# Patient Record
Sex: Female | Born: 1960 | Hispanic: Yes | Marital: Married | Smoking: Never smoker
Health system: Southern US, Community
[De-identification: ages and names within clinical notes are randomized; demographics above are authoritative.]

## PROBLEM LIST (undated history)

## (undated) DIAGNOSIS — T7840XA Allergy, unspecified, initial encounter: Secondary | ICD-10-CM

## (undated) DIAGNOSIS — J45909 Unspecified asthma, uncomplicated: Secondary | ICD-10-CM

## (undated) DIAGNOSIS — I10 Essential (primary) hypertension: Secondary | ICD-10-CM

## (undated) HISTORY — DX: Allergy, unspecified, initial encounter: T78.40XA

## (undated) HISTORY — PX: BREAST SURGERY: SHX581

---

## 2020-10-05 ENCOUNTER — Other Ambulatory Visit (HOSPITAL_COMMUNITY): Payer: Self-pay | Admitting: Sports Medicine

## 2020-10-05 DIAGNOSIS — Z1231 Encounter for screening mammogram for malignant neoplasm of breast: Secondary | ICD-10-CM

## 2020-10-06 ENCOUNTER — Ambulatory Visit (HOSPITAL_COMMUNITY)
Admission: RE | Admit: 2020-10-06 | Discharge: 2020-10-06 | Disposition: A | Discharge status: Home / Self Care | Payer: Self-pay | Source: Ambulatory Visit | Attending: Sports Medicine | Admitting: Sports Medicine

## 2020-10-06 ENCOUNTER — Other Ambulatory Visit: Payer: Self-pay

## 2020-10-06 DIAGNOSIS — Z1231 Encounter for screening mammogram for malignant neoplasm of breast: Secondary | ICD-10-CM | POA: Insufficient documentation

## 2021-09-20 ENCOUNTER — Other Ambulatory Visit (HOSPITAL_COMMUNITY): Payer: Self-pay | Admitting: Sports Medicine

## 2021-09-20 DIAGNOSIS — Z1231 Encounter for screening mammogram for malignant neoplasm of breast: Secondary | ICD-10-CM

## 2021-10-10 ENCOUNTER — Other Ambulatory Visit: Payer: Self-pay

## 2021-10-10 ENCOUNTER — Ambulatory Visit (HOSPITAL_COMMUNITY)
Admission: RE | Admit: 2021-10-10 | Discharge: 2021-10-10 | Disposition: A | Discharge status: Home / Self Care | Payer: Self-pay | Source: Ambulatory Visit | Attending: Sports Medicine | Admitting: Sports Medicine

## 2021-10-10 DIAGNOSIS — Z1231 Encounter for screening mammogram for malignant neoplasm of breast: Secondary | ICD-10-CM | POA: Insufficient documentation

## 2022-05-01 ENCOUNTER — Ambulatory Visit
Admission: EM | Admit: 2022-05-01 | Discharge: 2022-05-01 | Disposition: A | Discharge status: Home / Self Care | Payer: No Typology Code available for payment source

## 2022-05-01 DIAGNOSIS — F418 Other specified anxiety disorders: Secondary | ICD-10-CM | POA: Diagnosis not present

## 2022-05-01 DIAGNOSIS — G479 Sleep disorder, unspecified: Secondary | ICD-10-CM | POA: Diagnosis not present

## 2022-05-01 HISTORY — DX: Unspecified asthma, uncomplicated: J45.909

## 2022-05-01 MED ORDER — HYDROXYZINE HCL 25 MG PO TABS: 25.0000 mg | ORAL_TABLET | Freq: Three times a day (TID) | ORAL | 0 refills | Status: DC | PRN

## 2022-05-01 NOTE — ED Provider Notes (Signed)
[facility]    CSN: [id number] Arrival date & time: 05/01/22  1138      History   Chief Complaint Chief Complaint  Patient presents with   Fatigue   Insomnia    HPI Dawn Arellano is a 61 y.o. female.   The patient is a 61 year old female who presents with difficulty sleeping.  Patient states that she is having increased stress due to having [place].  Patient states that she is the sole provider for her household here, and for her parents.  She states that she is also having to make medical decisions for her parents as they are in their 90s.  Patient states over the past 2 days she is averaged about 4 hours of sleep.  She states that she normally gets about 7 hours of sleep each night.  She states that she does exercise regularly, but has not been able to because of the fatigue she is currently experiencing.  Patient states that she normally is able to handle stressful situations and that she is "normally strong", but states this has been a lot for her.  Patient denies suicidal or homicidal ideations.  Patient states that she normally takes melatonin or Benadryl to help her sleep, but these medications are not working for her at this time.  The history is provided by the patient.   Past Medical History:  Diagnosis Date   Asthma     There are no problems to display for this patient.   History reviewed. No pertinent surgical history.  OB History   No obstetric history on file.      Home Medications    Prior to Admission medications   Medication Sig Start Date End Date Taking? Authorizing Provider  diphenhydrAMINE (BENADRYL) 50 MG tablet Take 50 mg by mouth at bedtime as needed for itching.   Yes [provider]  glucosamine-chondroitin 500-400 MG tablet Take 1 tablet by mouth 3 (three) times daily.   Yes [provider]  hydrOXYzine (ATARAX) 25 MG tablet Take 1 tablet (25 mg total) by mouth every 8 (eight) hours  as needed. 05/01/22  Yes Vista Sawatzky-Warren, Sadie Haberhristie J, NP  lisinopril (ZESTRIL) 10 MG tablet Take 10 mg by mouth daily.   Yes [provider]  montelukast (SINGULAIR) 10 MG tablet Take 10 mg by mouth at bedtime.   Yes [provider]  Multiple Vitamin (MULTIVITAMIN) tablet Take 1 tablet by mouth daily.   Yes [provider]  vitamin B-12 (CYANOCOBALAMIN) 1000 MCG tablet Take 1,000 mcg by mouth daily.   Yes [provider]  vitamin E 1000 UNIT capsule Take 1,000 Units by mouth daily.   Yes [provider]  Zinc Sulfate (ZINC-220 PO) Take by mouth.   Yes [provider]  albuterol (VENTOLIN HFA) 108 (90 Base) MCG/ACT inhaler SMARTSIG:2 Puff(s) By Mouth Every 4 Hours 04/05/22   [provider]  fluticasone-salmeterol (ADVAIR) 100-50 MCG/ACT AEPB daily. 04/05/22   [provider]    Family History History reviewed. No pertinent family history.  Social History Social History   Tobacco Use   Smoking status: Never   Smokeless tobacco: Never  Substance Use Topics   Alcohol use: Yes   Drug use: Never     Allergies   Nsaids   Review of Systems Review of Systems  Constitutional:  Positive for fatigue.  Respiratory: Negative.    Cardiovascular: Negative.   Gastrointestinal: Negative.   Psychiatric/Behavioral:  Positive for  decreased concentration and sleep disturbance. Negative for self-injury and suicidal ideas. The patient is nervous/anxious.     Physical Exam Triage Vital Signs ED Triage Vitals  Enc Vitals Group     BP 05/01/22 1256 (!) 150/81     Pulse Rate 05/01/22 1256 76     Resp 05/01/22 1256 18     Temp 05/01/22 1256 98.3 F (36.8 C)     Temp Source 05/01/22 1256 Oral     SpO2 05/01/22 1256 98 %     Weight --      Height --      Head Circumference --      Peak Flow --      Pain Score 05/01/22 1253 0     Pain Loc --      Pain Edu? --      Excl. in GC? --    No data found.  Updated Vital Signs BP  (!) 150/81 (BP Location: Right Arm)   Pulse 76   Temp 98.3 F (36.8 C) (Oral)   Resp 18   SpO2 98%   Visual Acuity Right Eye Distance:   Left Eye Distance:   Bilateral Distance:    Right Eye Near:   Left Eye Near:    Bilateral Near:     Physical Exam Vitals and nursing note reviewed.  Constitutional:      Appearance: Normal appearance. She is normal weight.  HENT:     Head: Normocephalic.  Cardiovascular:     Rate and Rhythm: Normal rate and regular rhythm.     Pulses: Normal pulses.     Heart sounds: Normal heart sounds.  Pulmonary:     Effort: Pulmonary effort is normal.     Breath sounds: Normal breath sounds.  Abdominal:     General: Bowel sounds are normal.     Palpations: Abdomen is soft.  Skin:    General: Skin is warm and dry.  Neurological:     General: No focal deficit present.     Mental Status: She is alert and oriented to person, place, and time.  Psychiatric:        Attention and Perception: Attention normal.        Mood and Affect: Mood is anxious.        Thought Content: Thought content normal.     UC Treatments / Results  Labs (all labs ordered are listed, but only abnormal results are displayed) Labs Reviewed - No data to display  EKG   Radiology No results found.  Procedures Procedures (including critical care time)  Medications Ordered in UC Medications - No data to display  Initial Impression / Assessment and Plan / UC Course  I have reviewed the triage vital signs and the nursing notes.  Pertinent labs & imaging results that were available during my care of the patient were reviewed by me and considered in my medical decision making (see chart for details).  The patient is a 61 year old female who presents with fatigue and sleep disturbance.  Symptoms have been present for the past 2 days.  Symptoms are related to the patient's increased stress associated with her parents.  Patient uses Benadryl and melatonin for her sleep, but  this is no longer helping, or has not helped over the past 2 days.  Her mood is anxious on exam.  She denies suicidal or homicidal ideations.  We will start the patient on hydroxyzine to see if this helps with her sleep disturbance.  Patient was  advised to try to find things that she enjoys, eating a healthy diet, and continuing her exercise.  Patient was advised to follow-up with her primary care physician if symptoms worsen or do not improve. Final Clinical Impressions(s) / UC Diagnoses   Final diagnoses:  Situational anxiety  Sleep disturbance     Discharge Instructions      Take medications as prescribed.  Please be advised these medications will make you drowsy. Take time out to do things you enjoy such as exercise. Try to get at least 7 hours of sleep each night. Recommend a healthy diet to help with improving her sleep and decreasing her stress. Follow-up with your primary care physician if symptoms worsen or do not improve.     ED Prescriptions     Medication Sig Dispense Auth. Provider   hydrOXYzine (ATARAX) 25 MG tablet Take 1 tablet (25 mg total) by mouth every 8 (eight) hours as needed. 30 tablet Van Ehlert-Warren, Sadie Haber, NP      PDMP not reviewed this encounter.   Abran Cantor, NP 05/01/22 1331

## 2022-05-01 NOTE — Discharge Instructions (Addendum)
Take medications as prescribed.  Please be advised these medications will make you drowsy. Take time out to do things you enjoy such as exercise. Try to get at least 7 hours of sleep each night. Recommend a healthy diet to help with improving her sleep and decreasing her stress. Follow-up with your primary care physician if symptoms worsen or do not improve.

## 2022-05-01 NOTE — ED Triage Notes (Signed)
Pt reports she is having trouble to sleep for the past 2 days. Benadryl is not helping, Pt states she is having a lot of stress as her father is in [place] and he is going to be transfer to an independent living facility.

## 2022-07-05 ENCOUNTER — Observation Stay (HOSPITAL_COMMUNITY)
Admission: EM | Admit: 2022-07-05 | Discharge: 2022-07-05 | Disposition: A | Discharge status: Home / Self Care | Payer: No Typology Code available for payment source | Attending: Internal Medicine | Admitting: Internal Medicine

## 2022-07-05 ENCOUNTER — Other Ambulatory Visit: Payer: Self-pay

## 2022-07-05 ENCOUNTER — Encounter (HOSPITAL_COMMUNITY): Payer: Self-pay

## 2022-07-05 DIAGNOSIS — L03114 Cellulitis of left upper limb: Principal | ICD-10-CM | POA: Insufficient documentation

## 2022-07-05 DIAGNOSIS — Z79899 Other long term (current) drug therapy: Secondary | ICD-10-CM | POA: Insufficient documentation

## 2022-07-05 DIAGNOSIS — M7989 Other specified soft tissue disorders: Secondary | ICD-10-CM | POA: Diagnosis present

## 2022-07-05 DIAGNOSIS — I10 Essential (primary) hypertension: Secondary | ICD-10-CM | POA: Diagnosis not present

## 2022-07-05 DIAGNOSIS — W5501XA Bitten by cat, initial encounter: Secondary | ICD-10-CM

## 2022-07-05 DIAGNOSIS — J45909 Unspecified asthma, uncomplicated: Secondary | ICD-10-CM | POA: Insufficient documentation

## 2022-07-05 DIAGNOSIS — L03119 Cellulitis of unspecified part of limb: Secondary | ICD-10-CM | POA: Diagnosis present

## 2022-07-05 HISTORY — DX: Essential (primary) hypertension: I10

## 2022-07-05 LAB — CBC WITH DIFFERENTIAL/PLATELET
Abs Immature Granulocytes: 0.05 10*3/uL (ref 0.00–0.07)
Basophils Absolute: 0 10*3/uL (ref 0.0–0.1)
Basophils Relative: 0 %
Eosinophils Absolute: 0 10*3/uL (ref 0.0–0.5)
Eosinophils Relative: 0 %
HCT: 41.1 % (ref 36.0–46.0)
Hemoglobin: 14.1 g/dL (ref 12.0–15.0)
Immature Granulocytes: 0 %
Lymphocytes Relative: 12 %
Lymphs Abs: 1.7 10*3/uL (ref 0.7–4.0)
MCH: 31.9 pg (ref 26.0–34.0)
MCHC: 34.3 g/dL (ref 30.0–36.0)
MCV: 93 fL (ref 80.0–100.0)
Monocytes Absolute: 0.9 10*3/uL (ref 0.1–1.0)
Monocytes Relative: 6 %
Neutro Abs: 11.7 10*3/uL — ABNORMAL HIGH (ref 1.7–7.7)
Neutrophils Relative %: 82 %
Platelets: 298 10*3/uL (ref 150–400)
RBC: 4.42 MIL/uL (ref 3.87–5.11)
RDW: 13.4 % (ref 11.5–15.5)
WBC: 14.5 10*3/uL — ABNORMAL HIGH (ref 4.0–10.5)
nRBC: 0 % (ref 0.0–0.2)

## 2022-07-05 LAB — COMPREHENSIVE METABOLIC PANEL
ALT: 18 U/L (ref 0–44)
AST: 23 U/L (ref 15–41)
Albumin: 4.3 g/dL (ref 3.5–5.0)
Alkaline Phosphatase: 73 U/L (ref 38–126)
Anion gap: 10 (ref 5–15)
BUN: 17 mg/dL (ref 8–23)
CO2: 26 mmol/L (ref 22–32)
Calcium: 9.4 mg/dL (ref 8.9–10.3)
Chloride: 102 mmol/L (ref 98–111)
Creatinine, Ser: 0.67 mg/dL (ref 0.44–1.00)
GFR, Estimated: >60 mL/min (ref >60)
Glucose, Bld: 119 mg/dL — ABNORMAL HIGH (ref 70–99)
Potassium: 4.2 mmol/L (ref 3.5–5.1)
Sodium: 138 mmol/L (ref 135–145)
Total Bilirubin: 0.7 mg/dL (ref 0.3–1.2)
Total Protein: 7.6 g/dL (ref 6.5–8.1)

## 2022-07-05 MED ORDER — HYDROCODONE-ACETAMINOPHEN 5-325 MG PO TABS
1.0000 | ORAL_TABLET | Freq: Once | ORAL | Status: AC
  Administered 2022-07-05: 1 via ORAL
  Filled 2022-07-05: qty 1

## 2022-07-05 MED ORDER — DOXYCYCLINE HYCLATE 100 MG PO CAPS
100.0000 mg | ORAL_CAPSULE | Freq: Two times a day (BID) | ORAL | 0 refills | Status: DC
Start: 2022-07-05 — End: 2023-05-23

## 2022-07-05 MED ORDER — HYDROCODONE-ACETAMINOPHEN 5-325 MG PO TABS: 1.0000 | ORAL_TABLET | Freq: Four times a day (QID) | ORAL | 0 refills | Status: DC | PRN

## 2022-07-05 MED ORDER — DOXYCYCLINE HYCLATE 100 MG PO TABS: 100.0000 mg | ORAL_TABLET | Freq: Two times a day (BID) | ORAL | 0 refills | Status: DC

## 2022-07-05 MED ORDER — AMOXICILLIN-POT CLAVULANATE 875-125 MG PO TABS: 1.0000 | ORAL_TABLET | Freq: Two times a day (BID) | ORAL | 0 refills | Status: DC

## 2022-07-05 MED ORDER — SODIUM CHLORIDE 0.9 % IV SOLN
3.0000 g | Freq: Once | INTRAVENOUS | Status: AC
  Administered 2022-07-05: 3 g via INTRAVENOUS
  Filled 2022-07-05: qty 8

## 2022-07-05 MED ORDER — DOXYCYCLINE HYCLATE 100 MG PO TABS
100.0000 mg | ORAL_TABLET | Freq: Two times a day (BID) | ORAL | Status: DC
  Administered 2022-07-05: 100 mg via ORAL
  Filled 2022-07-05: qty 1

## 2022-07-05 MED ORDER — AMOXICILLIN-POT CLAVULANATE 875-125 MG PO TABS: 1.0000 | ORAL_TABLET | Freq: Two times a day (BID) | ORAL | Status: DC

## 2022-07-05 MED ORDER — ALBUTEROL SULFATE HFA 108 (90 BASE) MCG/ACT IN AERS
2.0000 | INHALATION_SPRAY | Freq: Once | RESPIRATORY_TRACT | Status: AC
  Administered 2022-07-05: 2 via RESPIRATORY_TRACT
  Filled 2022-07-05: qty 6.7

## 2022-07-05 NOTE — ED Triage Notes (Signed)
Patient with cat bite Monday to left arm, this is her own house cat with up to date shots.  Arm is red, swollen, and painful to the touch. Redness goes from wrist to upper arm and is hot to the touch.

## 2022-07-05 NOTE — Consult Note (Signed)
Initial Consultation Note   Patient: Dawn Arellano ZOX:[id number] DOB: 05/16/1961 PCP: Pcp, No DOA: 07/05/2022 DOS: the patient was seen and examined on 07/05/2022 Primary service: Catarina Hartshorn, MD  Referring physician: Estell Harpin Reason for consult: cellulitis arm  Assessment/Plan: Cellulitis Left Arm -pt wants to go home -she is nontoxic appearing, not septic -she wants to try po antibiotics -d/c home with amox/clav and doxy x 7 days -received one dose Unasyn in ED -Rx Norco 5/325 #20  Hypertension -Continue lisinopril  Asthma -Stable on room air -Continue home inhalers    TRH will sign off at present, please call us again when needed.  HPI: Dawn Arellano is a 61 y.o. female with past medical history of hypertension and asthma presenting with erythema, edema, and pain on her left arm.  The patient fell asleep around 9 PM on 07/03/2022.  She felt that her cat was trying to play with her and may have scratched or bitten her arm.  She felt fine and went to work on 07/04/2022.  However she began noticing some mild tenderness on the dorsal distal aspect of her left arm on the evening of 07/04/2022.  By the morning of 07/05/2022, the patient noted erythema spreading proximally up her arm up to her antecubital fossa with increasing edema, pain, and erythema.  She did notice 2 bite marks on the dorsal radial surface of her left arm, but she never noticed any drainage, or blood coming out.  She denies any fevers, chills, headache, neck pain, chest pain, shortness of breath, cough, hemoptysis, nausea, vomiting, diarrhea.  She denies any recent injuries or other trauma to the arm.  She denies any other rashes.  She has not been out in the woods hiking camping and fishing. In the ED, the patient was afebrile and hemodynamically stable.  WBC 14.5, hemoglobin 14.1, platelets 290,000.  Sodium 138, potassium 4.2, bicarbonate 26, BUN 17, creatinine 0.67.  LFTs were unremarkable. The patient was given a dose of  Unasyn and hydrocodone in the ED after which she began feeling better with less pain and improved mobility in the left arm.  The patient requested to be able to go home to take oral antibiotics.  Review of Systems: As mentioned in the history of present illness. All other systems reviewed and are negative. Past Medical History:  Diagnosis Date   Asthma    Hypertension    History reviewed. No pertinent surgical history. Social History:  reports that she has never smoked. She has never used smokeless tobacco. She reports current alcohol use. She reports that she does not use drugs.  Allergies  Allergen Reactions   Nsaids Anaphylaxis    History reviewed. No pertinent family history.  Prior to Admission medications   Medication Sig Start Date End Date Taking? Authorizing Provider  albuterol (VENTOLIN HFA) 108 (90 Base) MCG/ACT inhaler SMARTSIG:2 Puff(s) By Mouth Every 4 Hours 04/05/22  Yes [provider]  diphenhydrAMINE (BENADRYL) 50 MG tablet Take 50 mg by mouth at bedtime as needed for itching.   Yes [provider]  fluticasone-salmeterol (ADVAIR) 100-50 MCG/ACT AEPB Inhale 1 puff into the lungs daily. 04/05/22  Yes [provider]  HYDROcodone-acetaminophen (NORCO) 5-325 MG tablet Take 1 tablet by mouth every 6 (six) hours as needed for moderate pain. 07/05/22  Yes Amalya Salmons, Onalee Hua, MD  lisinopril (ZESTRIL) 10 MG tablet Take 10 mg by mouth daily.   Yes [provider]  montelukast (SINGULAIR) 10 MG tablet Take 10 mg by mouth at bedtime.  Yes [provider]  Multiple Vitamin (MULTIVITAMIN) tablet Take 1 tablet by mouth daily.   Yes [provider]  vitamin B-12 (CYANOCOBALAMIN) 1000 MCG tablet Take 1,000 mcg by mouth daily.   Yes [provider]  vitamin E 1000 UNIT capsule Take 1,000 Units by mouth daily.   Yes [provider]  Zinc Sulfate (ZINC-220 PO) Take by mouth.   Yes [provider]   amoxicillin-clavulanate (AUGMENTIN) 875-125 MG tablet Take 1 tablet by mouth every 12 (twelve) hours. 07/05/22   Catarina Hartshorn, MD  doxycycline (VIBRA-TABS) 100 MG tablet Take 1 tablet (100 mg total) by mouth every 12 (twelve) hours. 07/05/22   Catarina Hartshorn, MD    Physical Exam: Vitals:   07/05/22 0917 07/05/22 1100  BP:  (!) 148/96  Pulse:  (!) 101  Resp:  16  SpO2:  99%  Weight: 52.2 kg   Height: 5\' 2"  (1.575 m)   GENERAL:  A&O x 3, NAD, well developed, cooperative, follows commands HEENT: [place]/AT, No thrush, No icterus, No oral ulcers Neck:  No neck mass, No meningismus, soft, supple CV: RRR, no S3, no S4, no rub, no JVD Lungs:  CTA, no wheeze, no rhonchi, good air movement Abd: soft/NT +BS, nondistended Ext: No edema, no lymphangitis, no cyanosis, no rashes Neuro:  CN II-XII intact, strength 4/5 in RUE, RLE, strength 4/5 LUE, LLE; sensation intact bilateral; no dysmetria; babinski equivocal Left arm with erythema from the radial head extending to the antecubital fossa primarily on the dorsal aspect.  There is edema and erythema without any crepitance.  There is no open drainage.  There is 2 bite marks on the dorsal surface of the radial side without any necrosis or drainage.  Data Reviewed:  Data reviewed in history above  Family Communication: none Primary team communication: none Thank you very much for involving in the care of your patient.  Author: [place], MD 07/05/2022 11:50 AM  For on call review [email or web address].

## 2022-07-05 NOTE — Discharge Instructions (Signed)
Follow-up as instructed by Dr. Arbutus Leas

## 2022-07-05 NOTE — ED Provider Notes (Signed)
[facility] EMERGENCY DEPARTMENT Provider Note   CSN: [id number] Arrival date & time: 07/05/22  5277     History {Add pertinent medical, surgical, social history, OB history to HPI:1} Chief Complaint  Patient presents with   Animal Bite    Dawn Arellano is a 61 y.o. female.  Patient with a history of bronchospasm.  She states her cat bit her left arm on Monday and now she has swelling and redness and tenderness to the arm   Animal Bite      Home Medications Prior to Admission medications   Medication Sig Start Date End Date Taking? Authorizing Provider  albuterol (VENTOLIN HFA) 108 (90 Base) MCG/ACT inhaler SMARTSIG:2 Puff(s) By Mouth Every 4 Hours 04/05/22  Yes [provider]  diphenhydrAMINE (BENADRYL) 50 MG tablet Take 50 mg by mouth at bedtime as needed for itching.   Yes [provider]  fluticasone-salmeterol (ADVAIR) 100-50 MCG/ACT AEPB Inhale 1 puff into the lungs daily. 04/05/22  Yes [provider]  lisinopril (ZESTRIL) 10 MG tablet Take 10 mg by mouth daily.   Yes [provider]  montelukast (SINGULAIR) 10 MG tablet Take 10 mg by mouth at bedtime.   Yes [provider]  Multiple Vitamin (MULTIVITAMIN) tablet Take 1 tablet by mouth daily.   Yes [provider]  vitamin B-12 (CYANOCOBALAMIN) 1000 MCG tablet Take 1,000 mcg by mouth daily.   Yes [provider]  vitamin E 1000 UNIT capsule Take 1,000 Units by mouth daily.   Yes [provider]  Zinc Sulfate (ZINC-220 PO) Take by mouth.   Yes [provider]  hydrOXYzine (ATARAX) 25 MG tablet Take 1 tablet (25 mg total) by mouth every 8 (eight) hours as needed. Patient not taking: Reported on 07/05/2022 05/01/22   Leath-Warren, Sadie Haber, NP      Allergies    Nsaids    Review of Systems   Review of Systems  Physical Exam Updated Vital Signs BP (!) 148/96   Pulse (!) 101   Resp 16   Ht 5\' 2"  (1.575 m)   Wt 52.2 kg   SpO2 99%    BMI 21.03 kg/m  Physical Exam  ED Results / Procedures / Treatments   Labs (all labs ordered are listed, but only abnormal results are displayed) Labs Reviewed  CBC WITH DIFFERENTIAL/PLATELET - Abnormal; Notable for the following components:      Result Value   WBC 14.5 (*)    Neutro Abs 11.7 (*)    All other components within normal limits  COMPREHENSIVE METABOLIC PANEL - Abnormal; Notable for the following components:   Glucose, Bld 119 (*)    All other components within normal limits    EKG None  Radiology No results found.  Procedures Procedures  {Document cardiac monitor, telemetry assessment procedure when appropriate:1}  Medications Ordered in ED Medications  Ampicillin-Sulbactam (UNASYN) 3 g in sodium chloride 0.9 % 100 mL IVPB (3 g Intravenous New Bag/Given 07/05/22 1004)  HYDROcodone-acetaminophen (NORCO/VICODIN) 5-325 MG per tablet 1 tablet (1 tablet Oral Given 07/05/22 1000)  albuterol (VENTOLIN HFA) 108 (90 Base) MCG/ACT inhaler 2 puff (2 puffs Inhalation Given 07/05/22 1020)    ED Course/ Medical Decision Making/ A&P Patient was given Unasyn and observed for 2 hours.  After that time her infection has increased in size by 1 cm                         Medical Decision Making  Amount and/or Complexity of Data Reviewed Labs: ordered.  Risk Prescription drug management. Decision regarding hospitalization.   Cellulitis to left arm.  She will be admitted for IV antibiotics    Patient was seen by the hospitalist and it was decided that the patient go home on Augmentin and doxycycline  {Document critical care time when appropriate:1} {Document review of labs and clinical decision tools ie heart score, Chads2Vasc2 etc:1}  {Document your independent review of radiology images, and any outside records:1} {Document your discussion with family members, caretakers, and with consultants:1} {Document social determinants of health affecting pt's care:1} {Document  your decision making why or why not admission, treatments were needed:1} Final Clinical Impression(s) / ED Diagnoses Final diagnoses:  None    Rx / DC Orders ED Discharge Orders     None

## 2022-08-25 ENCOUNTER — Other Ambulatory Visit (HOSPITAL_COMMUNITY)
Admission: RE | Admit: 2022-08-25 | Discharge: 2022-08-25 | Disposition: A | Discharge status: Home / Self Care | Payer: No Typology Code available for payment source | Source: Ambulatory Visit | Attending: Internal Medicine | Admitting: Internal Medicine

## 2022-08-25 DIAGNOSIS — I10 Essential (primary) hypertension: Secondary | ICD-10-CM | POA: Insufficient documentation

## 2022-08-25 DIAGNOSIS — K219 Gastro-esophageal reflux disease without esophagitis: Secondary | ICD-10-CM | POA: Insufficient documentation

## 2022-08-25 LAB — BASIC METABOLIC PANEL
Anion gap: 10 (ref 5–15)
BUN: 27 mg/dL — ABNORMAL HIGH (ref 8–23)
CO2: 25 mmol/L (ref 22–32)
Calcium: 9.1 mg/dL (ref 8.9–10.3)
Chloride: 103 mmol/L (ref 98–111)
Creatinine, Ser: 0.58 mg/dL (ref 0.44–1.00)
GFR, Estimated: >60 mL/min (ref >60)
Glucose, Bld: 99 mg/dL (ref 70–99)
Potassium: 4 mmol/L (ref 3.5–5.1)
Sodium: 138 mmol/L (ref 135–145)

## 2022-08-25 LAB — HEPATIC FUNCTION PANEL
ALT: 18 U/L (ref 0–44)
AST: 21 U/L (ref 15–41)
Albumin: 4.2 g/dL (ref 3.5–5.0)
Alkaline Phosphatase: 69 U/L (ref 38–126)
Bilirubin, Direct: <0.1 mg/dL (ref 0.0–0.2)
Total Bilirubin: 0.8 mg/dL (ref 0.3–1.2)
Total Protein: 7.2 g/dL (ref 6.5–8.1)

## 2022-08-25 LAB — CBC
HCT: 40.8 % (ref 36.0–46.0)
Hemoglobin: 13.8 g/dL (ref 12.0–15.0)
MCH: 31.5 pg (ref 26.0–34.0)
MCHC: 33.8 g/dL (ref 30.0–36.0)
MCV: 93.2 fL (ref 80.0–100.0)
Platelets: 270 10*3/uL (ref 150–400)
RBC: 4.38 MIL/uL (ref 3.87–5.11)
RDW: 12.9 % (ref 11.5–15.5)
WBC: 4.9 10*3/uL (ref 4.0–10.5)
nRBC: 0 % (ref 0.0–0.2)

## 2022-08-25 LAB — LIPID PANEL
Cholesterol: 220 mg/dL — ABNORMAL HIGH (ref 0–200)
HDL: 65 mg/dL (ref >40)
LDL Cholesterol: 136 mg/dL — ABNORMAL HIGH (ref 0–99)
Total CHOL/HDL Ratio: 3.4 RATIO
Triglycerides: 96 mg/dL (ref <150)
VLDL: 19 mg/dL (ref 0–40)

## 2022-08-25 LAB — TSH: TSH: 1.358 u[IU]/mL (ref 0.350–4.500)

## 2022-08-25 LAB — HEMOGLOBIN A1C
Hgb A1c MFr Bld: 5.5 % (ref 4.8–5.6)
Mean Plasma Glucose: 111.15 mg/dL

## 2022-08-25 LAB — VITAMIN D 25 HYDROXY (VIT D DEFICIENCY, FRACTURES): Vit D, 25-Hydroxy: 36.69 ng/mL (ref 30–100)

## 2022-08-28 LAB — T4, FREE: Free T4: 0.95 ng/dL (ref 0.61–1.12)

## 2022-09-11 ENCOUNTER — Other Ambulatory Visit (HOSPITAL_COMMUNITY): Payer: Self-pay | Admitting: Internal Medicine

## 2022-09-11 DIAGNOSIS — Z1231 Encounter for screening mammogram for malignant neoplasm of breast: Secondary | ICD-10-CM

## 2022-10-12 ENCOUNTER — Ambulatory Visit (HOSPITAL_COMMUNITY)
Admission: RE | Admit: 2022-10-12 | Discharge: 2022-10-12 | Disposition: A | Discharge status: Home / Self Care | Payer: No Typology Code available for payment source | Source: Ambulatory Visit | Attending: Internal Medicine | Admitting: Internal Medicine

## 2022-10-12 DIAGNOSIS — Z1231 Encounter for screening mammogram for malignant neoplasm of breast: Secondary | ICD-10-CM | POA: Insufficient documentation

## 2023-05-23 ENCOUNTER — Ambulatory Visit (INDEPENDENT_AMBULATORY_CARE_PROVIDER_SITE_OTHER): Payer: 59 | Admitting: Family Medicine

## 2023-05-23 ENCOUNTER — Other Ambulatory Visit: Payer: Self-pay | Admitting: Family Medicine

## 2023-05-23 ENCOUNTER — Encounter: Payer: Self-pay | Admitting: Family Medicine

## 2023-05-23 VITALS — BP 134/84 | HR 81 | Resp 16 | Ht 61.0 in | Wt 111.8 lb

## 2023-05-23 DIAGNOSIS — G479 Sleep disorder, unspecified: Secondary | ICD-10-CM | POA: Diagnosis not present

## 2023-05-23 DIAGNOSIS — M2041 Other hammer toe(s) (acquired), right foot: Secondary | ICD-10-CM

## 2023-05-23 DIAGNOSIS — I10 Essential (primary) hypertension: Secondary | ICD-10-CM | POA: Diagnosis not present

## 2023-05-23 DIAGNOSIS — E7849 Other hyperlipidemia: Secondary | ICD-10-CM

## 2023-05-23 DIAGNOSIS — Z1211 Encounter for screening for malignant neoplasm of colon: Secondary | ICD-10-CM

## 2023-05-23 DIAGNOSIS — J4520 Mild intermittent asthma, uncomplicated: Secondary | ICD-10-CM | POA: Diagnosis not present

## 2023-05-23 DIAGNOSIS — Z13820 Encounter for screening for osteoporosis: Secondary | ICD-10-CM

## 2023-05-23 DIAGNOSIS — E038 Other specified hypothyroidism: Secondary | ICD-10-CM

## 2023-05-23 DIAGNOSIS — Z114 Encounter for screening for human immunodeficiency virus [HIV]: Secondary | ICD-10-CM

## 2023-05-23 DIAGNOSIS — R7301 Impaired fasting glucose: Secondary | ICD-10-CM | POA: Diagnosis not present

## 2023-05-23 DIAGNOSIS — Z1159 Encounter for screening for other viral diseases: Secondary | ICD-10-CM

## 2023-05-23 DIAGNOSIS — E559 Vitamin D deficiency, unspecified: Secondary | ICD-10-CM | POA: Diagnosis not present

## 2023-05-23 MED ORDER — AIRSUPRA 90-80 MCG/ACT IN AERO
2.0000 | INHALATION_SPRAY | Freq: Four times a day (QID) | RESPIRATORY_TRACT | 2 refills | Status: DC | PRN
Start: 2023-05-23 — End: 2023-06-25

## 2023-05-23 MED ORDER — ALBUTEROL SULFATE HFA 108 (90 BASE) MCG/ACT IN AERS
2.0000 | INHALATION_SPRAY | Freq: Four times a day (QID) | RESPIRATORY_TRACT | 0 refills | Status: DC | PRN
Start: 2023-05-23 — End: 2024-06-25

## 2023-05-23 MED ORDER — MONTELUKAST SODIUM 10 MG PO TABS
10.0000 mg | ORAL_TABLET | Freq: Every day | ORAL | 1 refills | Status: DC
Start: 2023-05-23 — End: 2023-07-31

## 2023-05-23 MED ORDER — HYDROXYZINE PAMOATE 25 MG PO CAPS
25.0000 mg | ORAL_CAPSULE | Freq: Every evening | ORAL | 1 refills | Status: DC | PRN
Start: 2023-05-23 — End: 2023-07-31

## 2023-05-23 NOTE — Progress Notes (Signed)
New Patient Office Visit  Subjective:  Patient ID: Dawn Arellano, female    DOB: 1961/10/03  Age: 62 y.o. MRN: [id number]  CC:  Chief Complaint  Patient presents with   Establish Care   Insomnia    Has to take melatonin and benadryl in order to be able to fall asleep at night     HPI Dawn Arellano is a 62 y.o. female with past medical history of hypertension, mild intermittent asthma, and insomnia presents for establishing care. For the details of today's visit, please refer to the assessment and plan.     Past Medical History:  Diagnosis Date   Allergy Nsaids.   Asthma    Hypertension     Past Surgical History:  Procedure Laterality Date   BREAST SURGERY  2000   Lt intraductal papilloma    Family History  Problem Relation Age of Onset   Cancer Mother    Hypertension Mother    Cancer Father    Heart disease Father    Cancer Brother     Social History   Socioeconomic History   Marital status: Married    Spouse name: Not on file   Number of children: Not on file   Years of education: Not on file   Highest education level: Not on file  Occupational History   Not on file  Tobacco Use   Smoking status: Never   Smokeless tobacco: Never  Substance and Sexual Activity   Alcohol use: Yes    Alcohol/week: 2.0 standard drinks of alcohol    Types: 2 Glasses of wine per week   Drug use: Never   Sexual activity: Yes    Birth control/protection: Post-menopausal  Other Topics Concern   Not on file  Social History Narrative   Not on file   Social Determinants of Health   Financial Resource Strain: Not on file  Food Insecurity: Not on file  Transportation Needs: Not on file  Physical Activity: Not on file  Stress: Not on file  Social Connections: Not on file  Intimate Partner Violence: Not on file    ROS Review of Systems  Constitutional:  Negative for chills and fever.  Eyes:  Negative for visual disturbance.  Respiratory:  Negative for chest tightness,  shortness of breath and wheezing.   Neurological:  Negative for dizziness and headaches.  Psychiatric/Behavioral:  Positive for sleep disturbance.     Objective:   Today's Vitals: BP 134/84   Pulse 81   Resp 16   Ht 5\' 1"  (1.549 m)   Wt 111 lb 12.8 oz (50.7 kg)   SpO2 97%   BMI 21.12 kg/m   Physical Exam HENT:     Head: Normocephalic.     Mouth/Throat:     Mouth: Mucous membranes are moist.  Cardiovascular:     Rate and Rhythm: Normal rate.     Heart sounds: Normal heart sounds.  Pulmonary:     Effort: Pulmonary effort is normal.     Breath sounds: Normal breath sounds. No wheezing.  Chest:     Chest wall: No tenderness.  Musculoskeletal:     Right foot: Deformity (hammer toe of the right second toe) and bunion present.  Neurological:     Mental Status: She is alert.      Assessment & Plan:   Primary hypertension Assessment & Plan: Controlled She takes lisinopril 10 mg daily Asymptomatic in the clinic Low-sodium diet with increased physical activity encouraged BP Readings from Last 3 Encounters:  05/23/23 134/84  07/05/22 (!) 157/97  05/01/22 (!) 150/81       Hammer toe of right foot Assessment & Plan: Complains of pain in her right second toe after prolonged standing for 4 to 5 hours Bunion present at the MTP joint of the right great toe with no pain, swelling, or evidence of  inflammation No recent injury or trauma reported Would like a referral to [facility] for the ankle associated with Dr. Allena Katz Range of motion is present in all 10 toes, sensation intact, neurovascularly intact Referral placed   Orders: -     Ambulatory referral to Podiatry  Sleep disturbance Assessment & Plan: Chronic condition She reports taking over-the-counter Benadryl and melatonin to help her sleep She reports getting at least 6 hours of sleep at night Encouraged to stop taking melatonin and Benadryl We will start a trial hydroxyzine 25 mg nightly as  needed  Orders: -     hydrOXYzine Pamoate; Take 1 capsule (25 mg total) by mouth at bedtime as needed.  Dispense: 30 capsule; Refill: 1  Mild intermittent asthma without complication Assessment & Plan: Controlled Denies symptoms of wheezing, shortness of breath, chest pain, palpitations She reports not taking her maintenance inhaler only her rescue inhaler Patient is requesting a refill today Albuterol is refilled and sent to the pharmacy Will consider starting airsupra rescue inhaler at her next appointment  Orders: -     Albuterol Sulfate HFA; Inhale 2 puffs into the lungs every 6 (six) hours as needed for wheezing or shortness of breath.  Dispense: 8 g; Refill: 0 -     Montelukast Sodium; Take 1 tablet (10 mg total) by mouth at bedtime.  Dispense: 30 tablet; Refill: 1  IFG (impaired fasting glucose) -     Hemoglobin A1c  Vitamin D deficiency -     VITAMIN D 25 Hydroxy (Vit-D Deficiency, Fractures)  Other specified hypothyroidism -     TSH + free T4  Other hyperlipidemia -     Lipid panel -     CMP14+EGFR -     CBC with Differential/Platelet  Encounter for screening for HIV -     HIV Antibody (routine testing w rflx)  Need for hepatitis C screening test -     Hepatitis C antibody  Colon cancer screening -     Cologuard  Osteoporosis screening -     DG Bone Density     Follow-up: Return in about 2 weeks (around 06/06/2023) for pap smear.   Gilmore Laroche, FNP

## 2023-05-23 NOTE — Patient Instructions (Addendum)
I appreciate the opportunity to provide care to you today!    Follow up: 2 weeks for pap smear  Labs: please stop by the lab today/during the week to get your blood drawn (CBC, CMP, TSH, Lipid profile, HgA1c, Vit D)  Screening: HIV and Hep C, dexa scan for bone density, cologuard for colon cancer screening  Please pick up your refills at the pharmacy  Referrals today- Podiatry ([facility])   Please continue to a heart-healthy diet and increase your physical activities. Try to exercise for at least five days a week.      It was a pleasure to see you and I look forward to continuing to work together on your health and well-being. Please do not hesitate to call the office if you need care or have questions about your care.   Have a wonderful day and week. With Gratitude, Gilmore Laroche MSN, FNP-BC

## 2023-05-23 NOTE — Assessment & Plan Note (Signed)
Controlled Denies symptoms of wheezing, shortness of breath, chest pain, palpitations She reports not taking her maintenance inhaler only her rescue inhaler Patient is requesting a refill today Albuterol is refilled and sent to the pharmacy Will consider starting airsupra rescue inhaler at her next appointment

## 2023-05-23 NOTE — Assessment & Plan Note (Signed)
Complains of pain in her right second toe after prolonged standing for 4 to 5 hours Bunion present at the MTP joint of the right great toe with no pain, swelling, or evidence of  inflammation No recent injury or trauma reported Would like a referral to [facility] for the ankle associated with Dr. Allena Katz Range of motion is present in all 10 toes, sensation intact, neurovascularly intact Referral placed

## 2023-05-23 NOTE — Assessment & Plan Note (Signed)
Controlled She takes lisinopril 10 mg daily Asymptomatic in the clinic Low-sodium diet with increased physical activity encouraged BP Readings from Last 3 Encounters:  05/23/23 134/84  07/05/22 (!) 157/97  05/01/22 (!) 150/81

## 2023-05-23 NOTE — Assessment & Plan Note (Signed)
Chronic condition She reports taking over-the-counter Benadryl and melatonin to help her sleep She reports getting at least 6 hours of sleep at night Encouraged to stop taking melatonin and Benadryl We will start a trial hydroxyzine 25 mg nightly as needed

## 2023-06-05 DIAGNOSIS — Z1211 Encounter for screening for malignant neoplasm of colon: Secondary | ICD-10-CM | POA: Diagnosis not present

## 2023-06-06 ENCOUNTER — Ambulatory Visit: Payer: 59 | Admitting: Family Medicine

## 2023-06-10 LAB — COLOGUARD: COLOGUARD: NEGATIVE

## 2023-06-11 DIAGNOSIS — E7849 Other hyperlipidemia: Secondary | ICD-10-CM | POA: Diagnosis not present

## 2023-06-11 DIAGNOSIS — Z114 Encounter for screening for human immunodeficiency virus [HIV]: Secondary | ICD-10-CM | POA: Diagnosis not present

## 2023-06-11 DIAGNOSIS — R7301 Impaired fasting glucose: Secondary | ICD-10-CM | POA: Diagnosis not present

## 2023-06-11 DIAGNOSIS — Z1159 Encounter for screening for other viral diseases: Secondary | ICD-10-CM | POA: Diagnosis not present

## 2023-06-11 DIAGNOSIS — E559 Vitamin D deficiency, unspecified: Secondary | ICD-10-CM | POA: Diagnosis not present

## 2023-06-11 DIAGNOSIS — E038 Other specified hypothyroidism: Secondary | ICD-10-CM | POA: Diagnosis not present

## 2023-06-11 NOTE — Progress Notes (Signed)
Please inform the patient that his cologurad is negative for colon cancer.A negative Cologuard result indicates a low likelihood that a colorectal cancer (CRC) or advanced adenoma (adenomatous polyps with more advanced pre-malignant features) is present.

## 2023-06-12 ENCOUNTER — Other Ambulatory Visit: Payer: Self-pay | Admitting: Family Medicine

## 2023-06-12 DIAGNOSIS — E7849 Other hyperlipidemia: Secondary | ICD-10-CM

## 2023-06-12 LAB — CBC WITH DIFFERENTIAL/PLATELET
Basophils Absolute: 0 10*3/uL (ref 0.0–0.2)
Basos: 1 %
EOS (ABSOLUTE): 0.2 10*3/uL (ref 0.0–0.4)
Eos: 3 %
Hematocrit: 38.6 % (ref 34.0–46.6)
Hemoglobin: 13.1 g/dL (ref 11.1–15.9)
Immature Grans (Abs): 0 10*3/uL (ref 0.0–0.1)
Immature Granulocytes: 0 %
Lymphocytes Absolute: 2 10*3/uL (ref 0.7–3.1)
Lymphs: 35 %
MCH: 31.6 pg (ref 26.6–33.0)
MCHC: 33.9 g/dL (ref 31.5–35.7)
MCV: 93 fL (ref 79–97)
Monocytes Absolute: 0.4 10*3/uL (ref 0.1–0.9)
Monocytes: 7 %
Neutrophils Absolute: 3.1 10*3/uL (ref 1.4–7.0)
Neutrophils: 54 %
Platelets: 297 10*3/uL (ref 150–450)
RBC: 4.15 x10E6/uL (ref 3.77–5.28)
RDW: 12.4 % (ref 11.7–15.4)
WBC: 5.7 10*3/uL (ref 3.4–10.8)

## 2023-06-12 LAB — CMP14+EGFR
ALT: 17 IU/L (ref 0–32)
AST: 20 IU/L (ref 0–40)
Albumin: 4.1 g/dL (ref 3.9–4.9)
Alkaline Phosphatase: 73 IU/L (ref 44–121)
BUN/Creatinine Ratio: 33 — ABNORMAL HIGH (ref 12–28)
BUN: 23 mg/dL (ref 8–27)
Bilirubin Total: 0.3 mg/dL (ref 0.0–1.2)
CO2: 24 mmol/L (ref 20–29)
Calcium: 9.3 mg/dL (ref 8.7–10.3)
Chloride: 101 mmol/L (ref 96–106)
Creatinine, Ser: 0.69 mg/dL (ref 0.57–1.00)
Globulin, Total: 2.5 g/dL (ref 1.5–4.5)
Glucose: 88 mg/dL (ref 70–99)
Potassium: 4.5 mmol/L (ref 3.5–5.2)
Sodium: 140 mmol/L (ref 134–144)
Total Protein: 6.6 g/dL (ref 6.0–8.5)
eGFR: 97 mL/min/{1.73_m2} (ref >59)

## 2023-06-12 LAB — HIV ANTIBODY (ROUTINE TESTING W REFLEX): HIV Screen 4th Generation wRfx: NONREACTIVE

## 2023-06-12 LAB — LIPID PANEL
Chol/HDL Ratio: 4.7 ratio — ABNORMAL HIGH (ref 0.0–4.4)
Cholesterol, Total: 281 mg/dL — ABNORMAL HIGH (ref 100–199)
HDL: 60 mg/dL (ref >39)
LDL Chol Calc (NIH): 177 mg/dL — ABNORMAL HIGH (ref 0–99)
Triglycerides: 234 mg/dL — ABNORMAL HIGH (ref 0–149)
VLDL Cholesterol Cal: 44 mg/dL — ABNORMAL HIGH (ref 5–40)

## 2023-06-12 LAB — TSH+FREE T4
Free T4: 1.27 ng/dL (ref 0.82–1.77)
TSH: 1.78 u[IU]/mL (ref 0.450–4.500)

## 2023-06-12 LAB — HEMOGLOBIN A1C
Est. average glucose Bld gHb Est-mCnc: 123 mg/dL
Hgb A1c MFr Bld: 5.9 % — ABNORMAL HIGH (ref 4.8–5.6)

## 2023-06-12 LAB — HEPATITIS C ANTIBODY: Hep C Virus Ab: NONREACTIVE

## 2023-06-12 LAB — VITAMIN D 25 HYDROXY (VIT D DEFICIENCY, FRACTURES): Vit D, 25-Hydroxy: 31 ng/mL (ref 30.0–100.0)

## 2023-06-12 MED ORDER — ROSUVASTATIN CALCIUM 10 MG PO TABS
10.0000 mg | ORAL_TABLET | Freq: Every day | ORAL | 3 refills | Status: DC
Start: 2023-06-12 — End: 2024-09-15

## 2023-06-12 NOTE — Progress Notes (Signed)
The 10-year ASCVD risk score (Arnett DK, et al., 2019) is: 8.5%   Values used to calculate the score:     Age: 62 years     Sex: Female     Is Non-Hispanic African American: No     Diabetic: No     Tobacco smoker: No     Systolic Blood Pressure: 134 mmHg     Is BP treated: Yes     HDL Cholesterol: 60 mg/dL     Total Cholesterol: 281 mg/dL

## 2023-06-20 ENCOUNTER — Ambulatory Visit (INDEPENDENT_AMBULATORY_CARE_PROVIDER_SITE_OTHER): Payer: 59 | Admitting: Family Medicine

## 2023-06-20 ENCOUNTER — Encounter: Payer: Self-pay | Admitting: Family Medicine

## 2023-06-20 ENCOUNTER — Other Ambulatory Visit (HOSPITAL_COMMUNITY)
Admission: RE | Admit: 2023-06-20 | Discharge: 2023-06-20 | Disposition: A | Discharge status: Home / Self Care | Payer: 59 | Source: Ambulatory Visit | Attending: Family Medicine | Admitting: Family Medicine

## 2023-06-20 VITALS — BP 122/73 | HR 92 | Ht 61.0 in | Wt 117.1 lb

## 2023-06-20 DIAGNOSIS — M62838 Other muscle spasm: Secondary | ICD-10-CM

## 2023-06-20 DIAGNOSIS — Z01419 Encounter for gynecological examination (general) (routine) without abnormal findings: Secondary | ICD-10-CM

## 2023-06-20 DIAGNOSIS — Z124 Encounter for screening for malignant neoplasm of cervix: Secondary | ICD-10-CM | POA: Insufficient documentation

## 2023-06-20 MED ORDER — BACLOFEN 10 MG PO TABS
10.0000 mg | ORAL_TABLET | Freq: Every evening | ORAL | 0 refills | Status: DC
Start: 2023-06-20 — End: 2023-07-31

## 2023-06-20 NOTE — Progress Notes (Signed)
Established Patient Office Visit  Subjective:  Patient ID: Dawn Arellano, female    DOB: 1961/04/05  Age: 62 y.o. MRN: [id number]  CC:  Chief Complaint  Patient presents with   Gynecologic Exam    Pap today.    HPI Dawn Arellano is a 62 y.o. female presents for a pap smear examination.  Past Medical History:  Diagnosis Date   Allergy Nsaids.   Asthma    Hypertension     Past Surgical History:  Procedure Laterality Date   BREAST SURGERY  2000   Lt intraductal papilloma    Family History  Problem Relation Age of Onset   Cancer Mother    Hypertension Mother    Cancer Father    Heart disease Father    Cancer Brother     Social History   Socioeconomic History   Marital status: Married    Spouse name: Not on file   Number of children: Not on file   Years of education: Not on file   Highest education level: Bachelor's degree (e.g., BA, AB, BS)  Occupational History   Not on file  Tobacco Use   Smoking status: Never   Smokeless tobacco: Never  Substance and Sexual Activity   Alcohol use: Yes    Alcohol/week: 2.0 standard drinks of alcohol    Types: 2 Glasses of wine per week   Drug use: Never   Sexual activity: Yes    Birth control/protection: Post-menopausal  Other Topics Concern   Not on file  Social History Narrative   Not on file   Social Determinants of Health   Financial Resource Strain: Low Risk  (06/16/2023)   Overall Financial Resource Strain (CARDIA)    Difficulty of Paying Living Expenses: Not hard at all  Food Insecurity: No Food Insecurity (06/16/2023)   Hunger Vital Sign    Worried About Running Out of Food in the Last Year: Never true    Ran Out of Food in the Last Year: Never true  Transportation Needs: No Transportation Needs (06/16/2023)   PRAPARE - Administrator, Civil Service (Medical): No    Lack of Transportation (Non-Medical): No  Physical Activity: Sufficiently Active (06/16/2023)   Exercise Vital Sign    Days of  Exercise per Week: 3 days    Minutes of Exercise per Session: 50 min  Stress: No Stress Concern Present (06/16/2023)   [employer] of Occupational Health - Occupational Stress Questionnaire    Feeling of Stress : Not at all  Social Connections: Socially Integrated (06/16/2023)   Social Connection and Isolation Panel [NHANES]    Frequency of Communication with Friends and Family: Twice a week    Frequency of Social Gatherings with Friends and Family: Twice a week    Attends Religious Services: 1 to 4 times per year    Active Member of [employer] or Organizations: Yes    Attends Banker Meetings: 1 to 4 times per year    Marital Status: Married  Catering manager Violence: Not on file    Outpatient Medications Prior to Visit  Medication Sig Dispense Refill   albuterol (VENTOLIN HFA) 108 (90 Base) MCG/ACT inhaler Inhale 2 puffs into the lungs every 6 (six) hours as needed for wheezing or shortness of breath. 8 g 0   Albuterol-Budesonide (AIRSUPRA) 90-80 MCG/ACT AERO Inhale 2 puffs into the lungs every 6 (six) hours as needed (SOB). 2 puffs of medicine is 1-dose. Do NOT use more than 6 dose (12 puffs)  in 24 hrs period. Rinse your mouth with water, If available, after using AIRSUPRA to decrease the chance of getting a fungal infection (thrush) in your mouth and throat. 5.7 g 2   HYDROcodone-acetaminophen (NORCO) 5-325 MG tablet Take 1 tablet by mouth every 6 (six) hours as needed for moderate pain. 20 tablet 0   hydrOXYzine (VISTARIL) 25 MG capsule Take 1 capsule (25 mg total) by mouth at bedtime as needed. 30 capsule 1   lisinopril (ZESTRIL) 10 MG tablet Take 10 mg by mouth daily.     montelukast (SINGULAIR) 10 MG tablet Take 1 tablet (10 mg total) by mouth at bedtime. 30 tablet 1   Multiple Vitamin (MULTIVITAMIN) tablet Take 1 tablet by mouth daily.     rosuvastatin (CRESTOR) 10 MG tablet Take 1 tablet (10 mg total) by mouth daily. 90 tablet 3   vitamin B-12 (CYANOCOBALAMIN) 1000 MCG  tablet Take 1,000 mcg by mouth daily.     vitamin E 1000 UNIT capsule Take 1,000 Units by mouth daily.     Zinc Sulfate (ZINC-220 PO) Take by mouth.     fluticasone-salmeterol (ADVAIR) 100-50 MCG/ACT AEPB Inhale 1 puff into the lungs daily.     No facility-administered medications prior to visit.    Allergies  Allergen Reactions   Nsaids Anaphylaxis    ROS Review of Systems  Constitutional:  Negative for chills and fever.  Eyes:  Negative for visual disturbance.  Respiratory:  Negative for chest tightness and shortness of breath.   Neurological:  Negative for dizziness and headaches.      Objective:    Physical Exam HENT:     Head: Normocephalic.     Mouth/Throat:     Mouth: Mucous membranes are moist.  Cardiovascular:     Rate and Rhythm: Normal rate.     Heart sounds: Normal heart sounds.  Pulmonary:     Effort: Pulmonary effort is normal.     Breath sounds: Normal breath sounds.  Genitourinary:    Exam position: Lithotomy position.     Tanner stage (genital): 5.     Labia:        Right: No tenderness.        Left: No tenderness.      Comments: Vaginal wall: pink and rugated, smooth and non-tender; absence of lesions, edema, and erythema. Labia Majora and Minora: present bilaterally, moist, soft tissue, and homogeneous; free of edema and ulcerations. Clitoris is anatomically present, above the urethral, and free of lesions, masses, and ulceration.  Musculoskeletal:     Cervical back: Spasms present.  Neurological:     Mental Status: She is alert.     BP 122/73   Pulse 92   Ht 5\' 1"  (1.549 m)   Wt 117 lb 1.3 oz (53.1 kg)   SpO2 95%   BMI 22.12 kg/m  Wt Readings from Last 3 Encounters:  06/20/23 117 lb 1.3 oz (53.1 kg)  05/23/23 111 lb 12.8 oz (50.7 kg)  07/05/22 115 lb (52.2 kg)    Lab Results  Component Value Date   TSH 1.780 06/11/2023   Lab Results  Component Value Date   WBC 5.7 06/11/2023   HGB 13.1 06/11/2023   HCT 38.6 06/11/2023   MCV  93 06/11/2023   PLT 297 06/11/2023   Lab Results  Component Value Date   NA 140 06/11/2023   K 4.5 06/11/2023   CO2 24 06/11/2023   GLUCOSE 88 06/11/2023   BUN 23 06/11/2023   CREATININE 0.69  06/11/2023   BILITOT 0.3 06/11/2023   ALKPHOS 73 06/11/2023   AST 20 06/11/2023   ALT 17 06/11/2023   PROT 6.6 06/11/2023   ALBUMIN 4.1 06/11/2023   CALCIUM 9.3 06/11/2023   ANIONGAP 10 08/25/2022   EGFR 97 06/11/2023   Lab Results  Component Value Date   CHOL 281 (H) 06/11/2023   Lab Results  Component Value Date   HDL 60 06/11/2023   Lab Results  Component Value Date   LDLCALC 177 (H) 06/11/2023   Lab Results  Component Value Date   TRIG 234 (H) 06/11/2023   Lab Results  Component Value Date   CHOLHDL 4.7 (H) 06/11/2023   Lab Results  Component Value Date   HGBA1C 5.9 (H) 06/11/2023      Assessment & Plan:  Cervical cancer screening Assessment & Plan: Pap completed Pending results  Orders: -     Cytology - PAP  Muscle spasm -     Baclofen; Take 1 tablet (10 mg total) by mouth at bedtime.  Dispense: 30 each; Refill: 0    Follow-up: Return in about 3 months (around 09/20/2023).   Gilmore Laroche, FNP

## 2023-06-20 NOTE — Patient Instructions (Addendum)
I appreciate the opportunity to provide care to you today!    Follow up:  3 months   [facility] phone # [phone]  Attached with your AVS, you will find valuable resources for self-education. I highly recommend dedicating some time to thoroughly examine them.   Please continue to a heart-healthy diet and increase your physical activities. Try to exercise for at least five days a week.    It was a pleasure to see you and I look forward to continuing to work together on your health and well-being. Please do not hesitate to call the office if you need care or have questions about your care.  In case of emergency, please visit the Emergency Department for urgent care, or contact our clinic at [phone] to schedule an appointment. We're here to help you!   Have a wonderful day and week. With Gratitude, Gilmore Laroche MSN, FNP-BC

## 2023-06-20 NOTE — Assessment & Plan Note (Signed)
Pap completed Pending results

## 2023-06-21 LAB — CYTOLOGY - PAP
Chlamydia: NEGATIVE
Comment: NEGATIVE
Comment: NEGATIVE
Comment: NORMAL
Diagnosis: NEGATIVE
High risk HPV: NEGATIVE
Neisseria Gonorrhea: NEGATIVE

## 2023-06-21 NOTE — Progress Notes (Signed)
Your Pap examination was negative for intraepithelial lesion or malignancy this means that there were no signs of precancerous changes or cancer were found in the cervical cells sampled during the exam; this is a normal result indicating the cervical cells are healthy and there are no indications of cervical cancer or precancer conditions.   You tested negative for gonorrhea and chlamydia

## 2023-06-25 ENCOUNTER — Other Ambulatory Visit: Payer: Self-pay

## 2023-06-25 ENCOUNTER — Ambulatory Visit (HOSPITAL_COMMUNITY)
Admission: RE | Admit: 2023-06-25 | Discharge: 2023-06-25 | Disposition: A | Discharge status: Home / Self Care | Payer: 59 | Source: Ambulatory Visit | Attending: Family Medicine | Admitting: Family Medicine

## 2023-06-25 ENCOUNTER — Encounter: Payer: Self-pay | Admitting: Family Medicine

## 2023-06-25 DIAGNOSIS — M85851 Other specified disorders of bone density and structure, right thigh: Secondary | ICD-10-CM | POA: Diagnosis not present

## 2023-06-25 DIAGNOSIS — Z13820 Encounter for screening for osteoporosis: Secondary | ICD-10-CM | POA: Diagnosis not present

## 2023-06-25 DIAGNOSIS — J4520 Mild intermittent asthma, uncomplicated: Secondary | ICD-10-CM

## 2023-06-25 DIAGNOSIS — Z780 Asymptomatic menopausal state: Secondary | ICD-10-CM | POA: Diagnosis not present

## 2023-06-25 MED ORDER — AIRSUPRA 90-80 MCG/ACT IN AERO
2.0000 | INHALATION_SPRAY | Freq: Four times a day (QID) | RESPIRATORY_TRACT | 2 refills | Status: DC | PRN
Start: 2023-06-25 — End: 2024-04-02
  Filled 2024-04-01: qty 10.7, 30d supply, fill #0

## 2023-06-25 NOTE — Progress Notes (Signed)
Please inform the patient that he dexa scan results shows osteopenia. This is another word for low bone mineral density or low bone mass. People with low bone mass generally have a lower risk of breaking a bone than people with osteoporosis. But their bone mineral density is below normal.i recommend a dietary intake of approximately 1200 mg daily calcium and ingest a total of 800 international units of vitamin D daily. If there is inadequate dietary intake of calcium, i recommend taking supplemental elemental calcium (generally 500 to 1000 mg/day.

## 2023-06-26 ENCOUNTER — Other Ambulatory Visit: Payer: Self-pay | Admitting: Oncology

## 2023-06-26 DIAGNOSIS — Z006 Encounter for examination for normal comparison and control in clinical research program: Secondary | ICD-10-CM

## 2023-07-16 ENCOUNTER — Other Ambulatory Visit: Payer: Self-pay | Admitting: Family Medicine

## 2023-07-18 ENCOUNTER — Encounter: Payer: Self-pay | Admitting: Family Medicine

## 2023-07-18 ENCOUNTER — Ambulatory Visit (INDEPENDENT_AMBULATORY_CARE_PROVIDER_SITE_OTHER): Payer: 59 | Admitting: Family Medicine

## 2023-07-18 VITALS — BP 131/86 | HR 98 | Ht 62.0 in | Wt 112.0 lb

## 2023-07-18 DIAGNOSIS — Z0001 Encounter for general adult medical examination with abnormal findings: Secondary | ICD-10-CM | POA: Insufficient documentation

## 2023-07-18 HISTORY — DX: Encounter for general adult medical examination with abnormal findings: Z00.01

## 2023-07-18 NOTE — Assessment & Plan Note (Signed)

## 2023-07-18 NOTE — Progress Notes (Addendum)
Complete physical exam  Patient: Dawn Arellano   DOB: Jul 12, 1961   62 y.o. Female  MRN: [id number]  Subjective:    Chief Complaint  Patient presents with   Annual Exam    Cpe today.     Dawn Arellano is a 62 y.o. female who presents today for a complete physical exam. She reports consuming a general diet. Home exercise routine includes walking 1 hrs per days. She generally feels well. She reports sleeping well. She does not have additional problems to discuss today.    Most recent fall risk assessment:    07/18/2023    9:29 AM  Fall Risk   Falls in the past year? 0  Number falls in past yr: 0  Injury with Fall? 0  Risk for fall due to : No Fall Risks  Follow up Falls evaluation completed     Most recent depression screenings:    07/18/2023    9:29 AM 06/20/2023    9:19 AM  PHQ 2/9 Scores  PHQ - 2 Score 0 0  PHQ- 9 Score 0 0    Dental: No current dental problems and Last dental visit: 05/2023  Patient Active Problem List   Diagnosis Date Noted   Encounter for annual general medical examination with abnormal findings in adult 07/18/2023   Cervical cancer screening 06/20/2023   Hypertension 05/23/2023   Hammer toe of right foot 05/23/2023   Sleep disturbance 05/23/2023   Mild intermittent asthma 05/23/2023   Cellulitis of arm 07/05/2022   Past Medical History:  Diagnosis Date   Allergy Nsaids.   Asthma    Encounter for annual general medical examination with abnormal findings in adult 07/18/2023   Hypertension    Past Surgical History:  Procedure Laterality Date   BREAST SURGERY  2000   Lt intraductal papilloma   Social History   Tobacco Use   Smoking status: Never   Smokeless tobacco: Never  Substance Use Topics   Alcohol use: Yes    Alcohol/week: 2.0 standard drinks of alcohol    Types: 2 Glasses of wine per week   Drug use: Never   Social History   Socioeconomic History   Marital status: Married    Spouse name: Not on file   Number of children:  Not on file   Years of education: Not on file   Highest education level: Bachelor's degree (e.g., BA, AB, BS)  Occupational History   Not on file  Tobacco Use   Smoking status: Never   Smokeless tobacco: Never  Substance and Sexual Activity   Alcohol use: Yes    Alcohol/week: 2.0 standard drinks of alcohol    Types: 2 Glasses of wine per week   Drug use: Never   Sexual activity: Yes    Birth control/protection: Post-menopausal  Other Topics Concern   Not on file  Social History Narrative   Not on file   Social Determinants of Health   Financial Resource Strain: Low Risk  (06/16/2023)   Overall Financial Resource Strain (CARDIA)    Difficulty of Paying Living Expenses: Not hard at all  Food Insecurity: No Food Insecurity (06/16/2023)   Hunger Vital Sign    Worried About Running Out of Food in the Last Year: Never true    Ran Out of Food in the Last Year: Never true  Transportation Needs: No Transportation Needs (06/16/2023)   PRAPARE - Administrator, Civil Service (Medical): No    Lack of Transportation (Non-Medical): No  Physical Activity: Sufficiently Active (06/16/2023)   Exercise Vital Sign    Days of Exercise per Week: 3 days    Minutes of Exercise per Session: 50 min  Stress: No Stress Concern Present (06/16/2023)   [employer] of Occupational Health - Occupational Stress Questionnaire    Feeling of Stress : Not at all  Social Connections: Socially Integrated (06/16/2023)   Social Connection and Isolation Panel [NHANES]    Frequency of Communication with Friends and Family: Twice a week    Frequency of Social Gatherings with Friends and Family: Twice a week    Attends Religious Services: 1 to 4 times per year    Active Member of [employer] or Organizations: Yes    Attends Banker Meetings: 1 to 4 times per year    Marital Status: Married  Catering manager Violence: Not on file   Family Status  Relation Name Status   Mother Byrd Hesselbach Alive   Father  [place] Alive   Brother Richard Alive  No partnership data on file   Family History  Problem Relation Age of Onset   Cancer Mother    Hypertension Mother    Cancer Father    Heart disease Father    Cancer Brother    Allergies  Allergen Reactions   Nsaids Anaphylaxis      Patient Care Team: Gilmore Laroche, FNP as PCP - General (Family Medicine)   Outpatient Medications Prior to Visit  Medication Sig   albuterol (VENTOLIN HFA) 108 (90 Base) MCG/ACT inhaler Inhale 2 puffs into the lungs every 6 (six) hours as needed for wheezing or shortness of breath.   Albuterol-Budesonide (AIRSUPRA) 90-80 MCG/ACT AERO Inhale 2 puffs into the lungs every 6 (six) hours as needed (SOB). 2 puffs of medicine is 1-dose. Do NOT use more than 6 dose (12 puffs) in 24 hrs period. Rinse your mouth with water, If available, after using AIRSUPRA to decrease the chance of getting a fungal infection (thrush) in your mouth and throat.   baclofen (LIORESAL) 10 MG tablet Take 1 tablet (10 mg total) by mouth at bedtime.   hydrOXYzine (VISTARIL) 25 MG capsule Take 1 capsule (25 mg total) by mouth at bedtime as needed.   lisinopril (ZESTRIL) 10 MG tablet Take 10 mg by mouth daily.   montelukast (SINGULAIR) 10 MG tablet Take 1 tablet (10 mg total) by mouth at bedtime.   Multiple Vitamin (MULTIVITAMIN) tablet Take 1 tablet by mouth daily.   rosuvastatin (CRESTOR) 10 MG tablet Take 1 tablet (10 mg total) by mouth daily.   vitamin B-12 (CYANOCOBALAMIN) 1000 MCG tablet Take 1,000 mcg by mouth daily.   vitamin E 1000 UNIT capsule Take 1,000 Units by mouth daily.   Zinc Sulfate (ZINC-220 PO) Take by mouth.   fluticasone-salmeterol (ADVAIR) 100-50 MCG/ACT AEPB Inhale 1 puff into the lungs daily.   HYDROcodone-acetaminophen (NORCO) 5-325 MG tablet Take 1 tablet by mouth every 6 (six) hours as needed for moderate pain.   No facility-administered medications prior to visit.    ROS     Objective:    BP 131/86   Pulse 98    Ht 5\' 2"  (1.575 m)   Wt 112 lb 0.6 oz (50.8 kg)   SpO2 98%   BMI 20.49 kg/m  BP Readings from Last 3 Encounters:  07/18/23 131/86  06/20/23 122/73  05/23/23 134/84   Wt Readings from Last 3 Encounters:  07/18/23 112 lb 0.6 oz (50.8 kg)  06/20/23 117 lb 1.3 oz (53.1 kg)  05/23/23 111 lb 12.8 oz (50.7 kg)      Physical Exam  No results found for any visits on 07/18/23. Last CBC Lab Results  Component Value Date   WBC 5.7 06/11/2023   HGB 13.1 06/11/2023   HCT 38.6 06/11/2023   MCV 93 06/11/2023   MCH 31.6 06/11/2023   RDW 12.4 06/11/2023   PLT 297 06/11/2023   Last metabolic panel Lab Results  Component Value Date   GLUCOSE 88 06/11/2023   NA 140 06/11/2023   K 4.5 06/11/2023   CL 101 06/11/2023   CO2 24 06/11/2023   BUN 23 06/11/2023   CREATININE 0.69 06/11/2023   EGFR 97 06/11/2023   CALCIUM 9.3 06/11/2023   PROT 6.6 06/11/2023   ALBUMIN 4.1 06/11/2023   LABGLOB 2.5 06/11/2023   BILITOT 0.3 06/11/2023   ALKPHOS 73 06/11/2023   AST 20 06/11/2023   ALT 17 06/11/2023   ANIONGAP 10 08/25/2022   Last lipids Lab Results  Component Value Date   CHOL 281 (H) 06/11/2023   HDL 60 06/11/2023   LDLCALC 177 (H) 06/11/2023   TRIG 234 (H) 06/11/2023   CHOLHDL 4.7 (H) 06/11/2023   Last hemoglobin A1c Lab Results  Component Value Date   HGBA1C 5.9 (H) 06/11/2023   Last thyroid functions Lab Results  Component Value Date   TSH 1.780 06/11/2023   Last vitamin D Lab Results  Component Value Date   VD25OH 31.0 06/11/2023   Last vitamin B12 and Folate No results found for: "VITAMINB12", "FOLATE"      Assessment & Plan:    Routine Health Maintenance and Physical Exam  Immunization History  Administered Date(s) Administered   Tdap 06/20/2015    Health Maintenance  Topic Date Due   COVID-19 Vaccine (1 - 2023-24 season) Never done   INFLUENZA VACCINE  07/12/2023   Zoster Vaccines- Shingrix (1 of 2) 09/20/2023 (Originally 05/26/2009)   MAMMOGRAM   10/12/2024   DTaP/Tdap/Td (2 - Td or Tdap) 06/19/2025   Fecal DNA (Cologuard)  06/04/2026   PAP SMEAR-Modifier  06/19/2026   Hepatitis C Screening  Completed   HIV Screening  Completed   HPV VACCINES  Aged Out    Discussed health benefits of physical activity, and encouraged her to engage in regular exercise appropriate for her age and condition.  Encounter for annual general medical examination with abnormal findings in adult Assessment & Plan: Physical exam as documented Discussed heart-healthy diet  Encouraged to Exercise: If you are able: 30 -60 minutes a day ,4 days a week, or 150 minutes a week. The longer the better. Combine stretch, strength, and aerobic activities Encourage to eat whole Food, Plant Predominant Nutrition is highly recommended: Eat Plenty of vegetables, Mushrooms, fruits, Legumes, Whole Grains, Nuts, seeds in lieu of processed meats, processed snacks/pastries red meat, poultry, eggs.  Will f/u in 1 year for CPE      Note: This chart has been completed using Engineer, civil (consulting) software, and while attempts have been made to ensure accuracy, certain words and phrases may not be transcribed as intended.    Return in about 1 year (around 07/17/2024) for CPE.     Gilmore Laroche, FNP

## 2023-07-18 NOTE — Patient Instructions (Addendum)
I appreciate the opportunity to provide care to you today!    Follow up:  09/23/2024   Attached with your AVS, you will find valuable resources for self-education. I highly recommend dedicating some time to thoroughly examine them.   Please continue to a heart-healthy diet and increase your physical activities. Try to exercise for at least five days a week.    It was a pleasure to see you and I look forward to continuing to work together on your health and well-being. Please do not hesitate to call the office if you need care or have questions about your care.  In case of emergency, please visit the Emergency Department for urgent care, or contact our clinic at [phone] to schedule an appointment. We're here to help you!   Have a wonderful day and week. With Gratitude, Gilmore Laroche MSN, FNP-BC

## 2023-07-31 ENCOUNTER — Other Ambulatory Visit: Payer: Self-pay | Admitting: Family Medicine

## 2023-07-31 DIAGNOSIS — J4520 Mild intermittent asthma, uncomplicated: Secondary | ICD-10-CM

## 2023-07-31 DIAGNOSIS — G479 Sleep disorder, unspecified: Secondary | ICD-10-CM

## 2023-07-31 DIAGNOSIS — M62838 Other muscle spasm: Secondary | ICD-10-CM

## 2023-08-25 IMAGING — MG MM DIGITAL SCREENING BILAT W/ TOMO AND CAD
8 series · 9 of 24 positions shown · non-contrast
Comparison: Previous exam(s).

CLINICAL DATA: Screening.

EXAM:
DIGITAL SCREENING BILATERAL MAMMOGRAM WITH TOMOSYNTHESIS AND CAD
TECHNIQUE: Bilateral screening digital craniocaudal and mediolateral oblique
mammograms were obtained. Bilateral screening digital breast
tomosynthesis was performed. The images were evaluated with
computer-aided detection.

[L CC synth-2D]
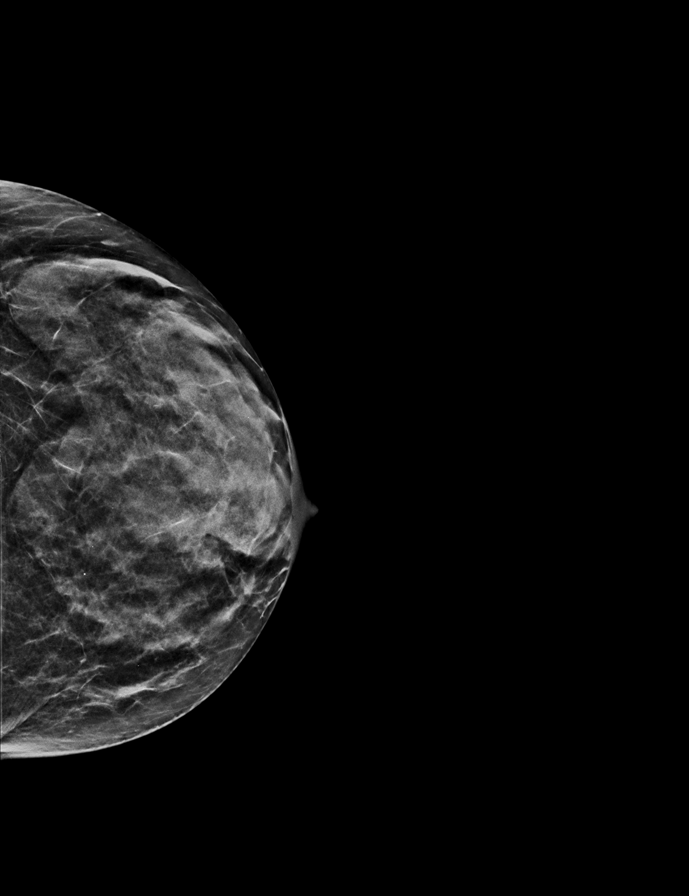

[L MLO synth-2D]
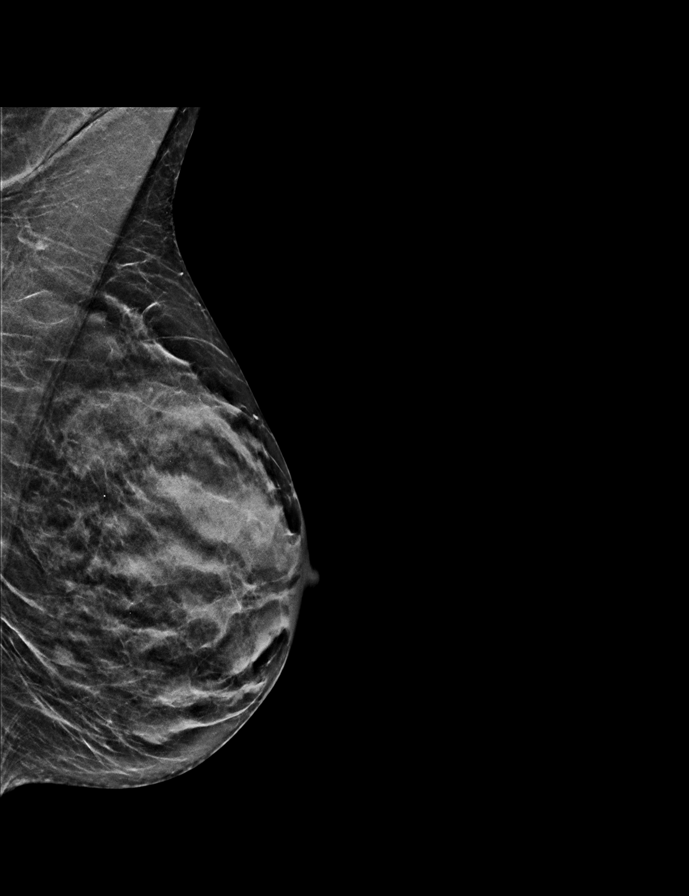

[R MLO synth-2D]
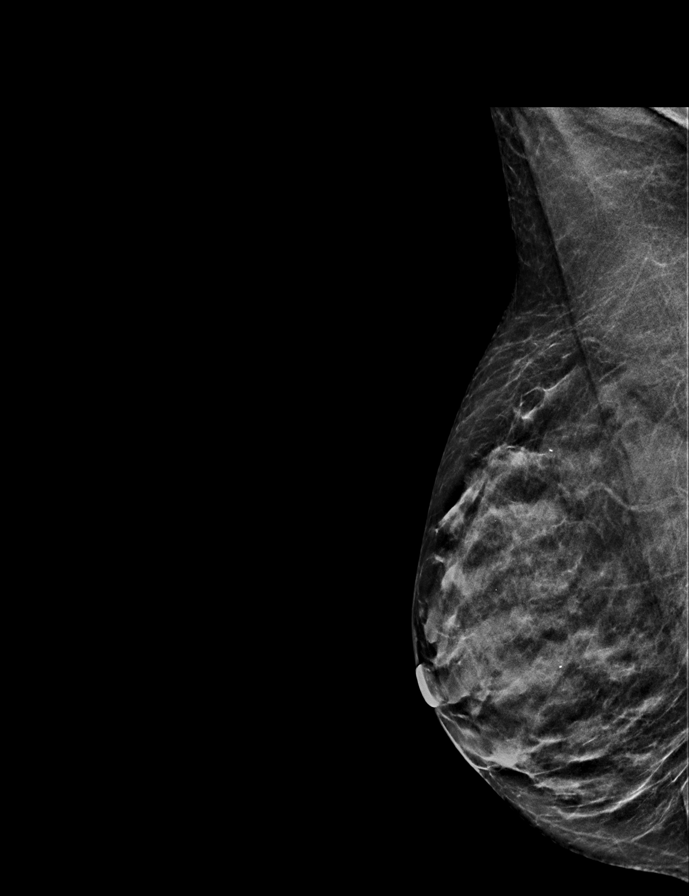

[R CC synth-2D]
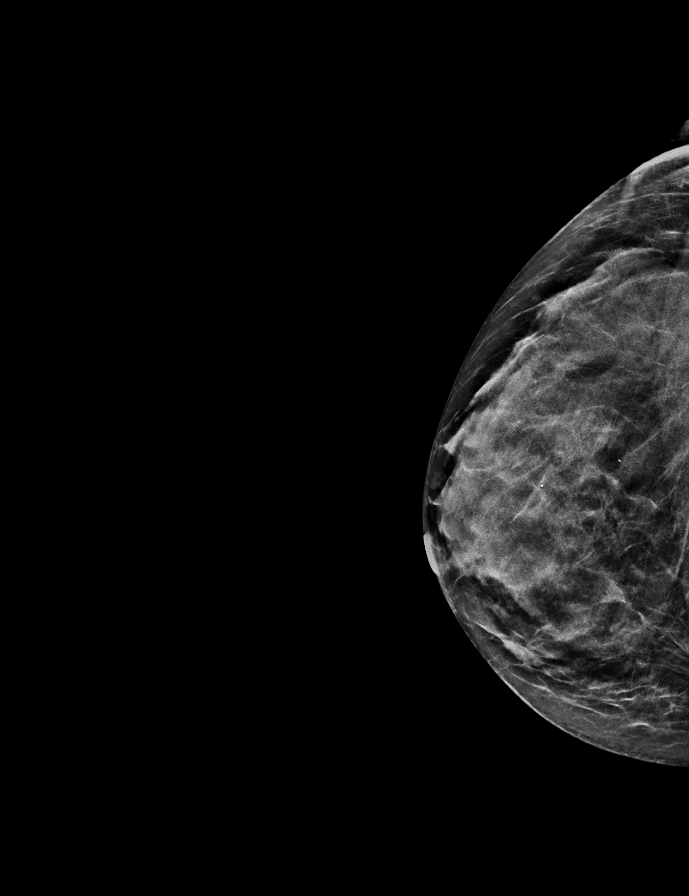

[R MLO tomo · 2 of 40 frames shown]
[frame 13/40]
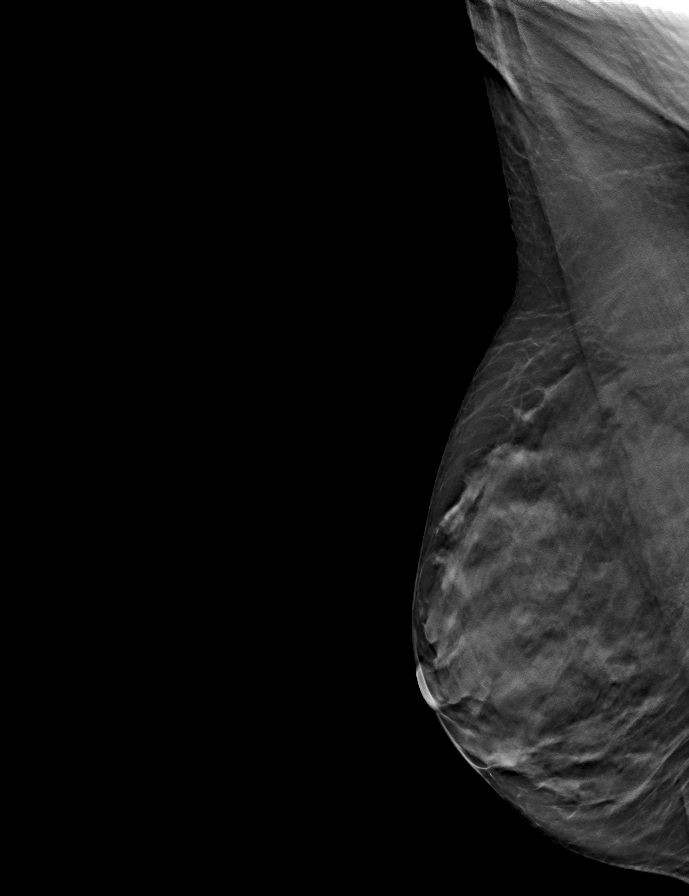
[frame 21/40]
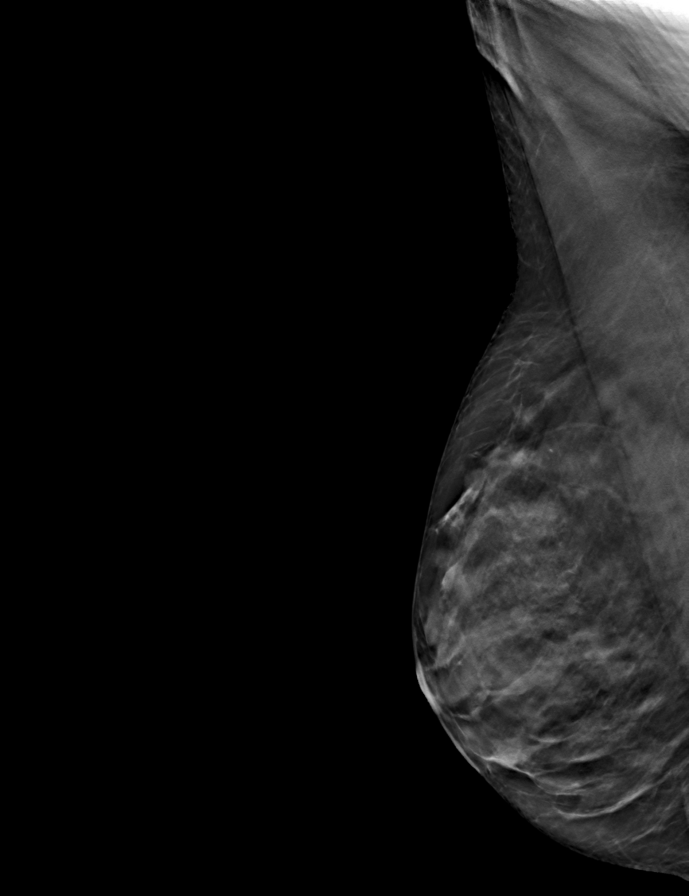

[L CC tomo · tomo slice 19/38.0]
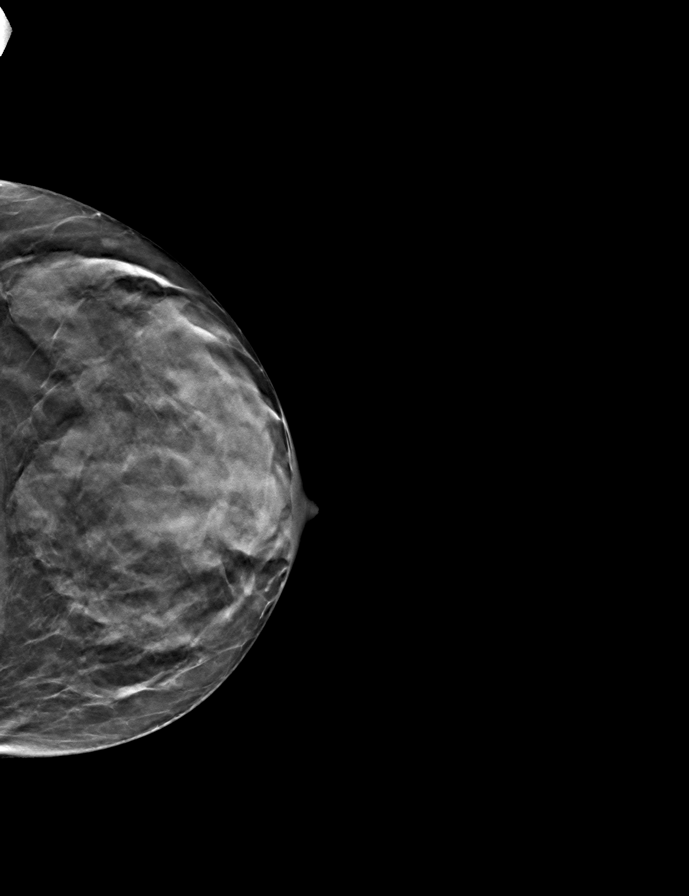

[R CC tomo · tomo slice 21/41.0]
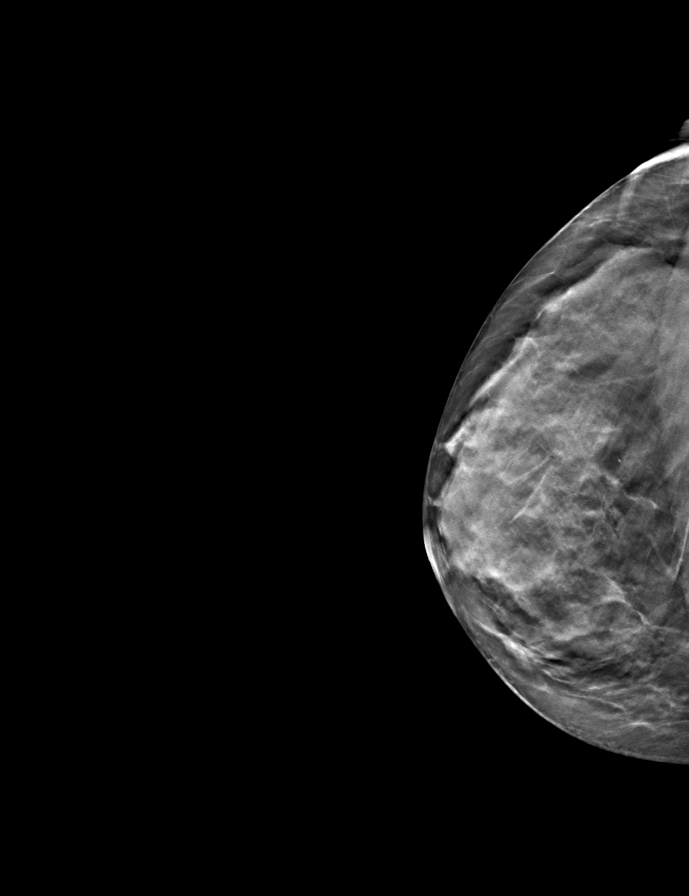

[L MLO tomo · tomo slice 20/39.0]
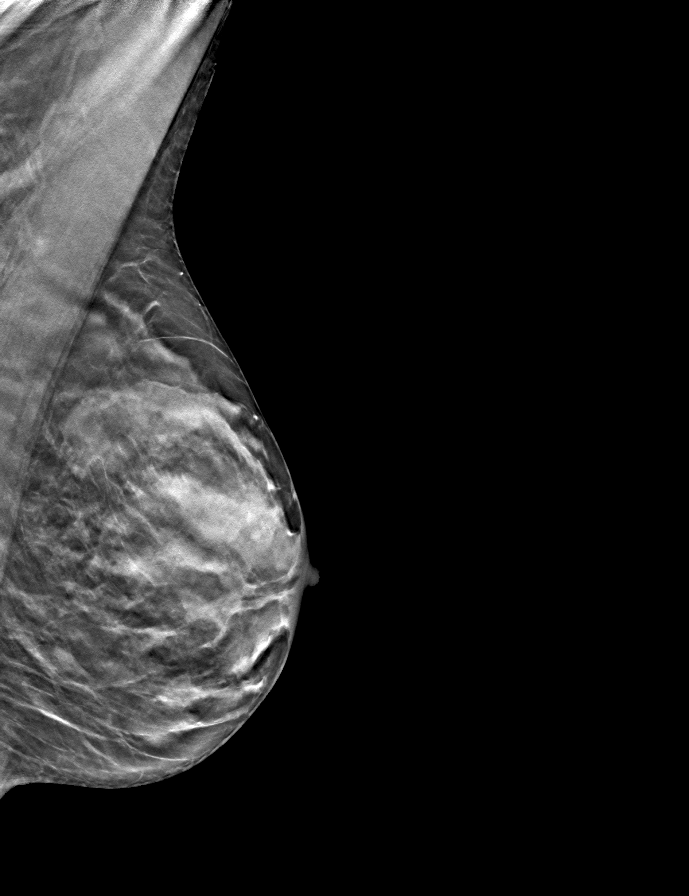

[9 of 24 positions shown; findings below may reference images not displayed]

ACR Breast Density Category c: The breast tissue is heterogeneously
dense, which may obscure small masses.
FINDINGS: There are no findings suspicious for malignancy.
IMPRESSION: No mammographic evidence of malignancy. A result letter of this
screening mammogram will be mailed directly to the patient.

RECOMMENDATION:
Screening mammogram in one year. (Code:Q3-W-BC3)

BI-RADS CATEGORY  1: Negative.

## 2023-09-02 ENCOUNTER — Other Ambulatory Visit: Payer: Self-pay | Admitting: Family Medicine

## 2023-09-02 DIAGNOSIS — M62838 Other muscle spasm: Secondary | ICD-10-CM

## 2023-09-02 DIAGNOSIS — G479 Sleep disorder, unspecified: Secondary | ICD-10-CM

## 2023-09-03 ENCOUNTER — Other Ambulatory Visit: Payer: Self-pay | Admitting: Family Medicine

## 2023-09-03 ENCOUNTER — Other Ambulatory Visit (HOSPITAL_COMMUNITY): Payer: Self-pay

## 2023-09-03 DIAGNOSIS — J4520 Mild intermittent asthma, uncomplicated: Secondary | ICD-10-CM

## 2023-09-03 MED ORDER — LISINOPRIL 10 MG PO TABS
10.0000 mg | ORAL_TABLET | Freq: Every day | ORAL | 0 refills | Status: DC
  Filled 2023-09-03: qty 30, 30d supply, fill #0

## 2023-09-24 ENCOUNTER — Encounter: Payer: Self-pay | Admitting: Family Medicine

## 2023-09-24 ENCOUNTER — Ambulatory Visit (INDEPENDENT_AMBULATORY_CARE_PROVIDER_SITE_OTHER): Payer: 59 | Admitting: Family Medicine

## 2023-09-24 VITALS — BP 120/77 | HR 91 | Ht 62.0 in | Wt 111.1 lb

## 2023-09-24 DIAGNOSIS — E559 Vitamin D deficiency, unspecified: Secondary | ICD-10-CM | POA: Diagnosis not present

## 2023-09-24 DIAGNOSIS — R7301 Impaired fasting glucose: Secondary | ICD-10-CM | POA: Diagnosis not present

## 2023-09-24 DIAGNOSIS — I10 Essential (primary) hypertension: Secondary | ICD-10-CM | POA: Diagnosis not present

## 2023-09-24 DIAGNOSIS — E785 Hyperlipidemia, unspecified: Secondary | ICD-10-CM

## 2023-09-24 DIAGNOSIS — E7849 Other hyperlipidemia: Secondary | ICD-10-CM

## 2023-09-24 HISTORY — DX: Hyperlipidemia, unspecified: E78.5

## 2023-09-24 NOTE — Assessment & Plan Note (Addendum)
Will consider initiating ezetimibe 10 mg daily based on her lab results. Encouraged to continue a heart-healthy diet and increase physical activity.   Lab Results  Component Value Date   CHOL 281 (H) 06/11/2023   HDL 60 06/11/2023   LDLCALC 177 (H) 06/11/2023   TRIG 234 (H) 06/11/2023   CHOLHDL 4.7 (H) 06/11/2023

## 2023-09-24 NOTE — Assessment & Plan Note (Addendum)
Controlled Encouraged lisinopril 10 mg daily Low-sodium diet with increased physical activity encouraged BP Readings from Last 3 Encounters:  09/24/23 120/77  07/18/23 131/86  06/20/23 122/73

## 2023-09-24 NOTE — Progress Notes (Signed)
Established Patient Office Visit  Subjective:  Patient ID: Dawn Arellano, female    DOB: 08-10-1961  Age: 62 y.o. MRN: [id number]  CC:  Chief Complaint  Patient presents with   Care Management    F/u to discuss cholesterol.    HPI Dawn Arellano is a 62 y.o. female with past medical history of hypertension presents for f/u of  chronic medical conditions. For the details of today's visit, please refer to the assessment and plan.   Hypertension: She takes lisinopril 10 mg daily and reports compliance with treatment. She is asymptomatic in the clinic and denies symptoms of headaches, dizziness, blurred vision, chest pain, or palpitations.  Hyperlipidemia: She reports stopping rosuvastatin 10 mg due to nausea. She has implemented lifestyle changes, including a heart-healthy diet and increased physical activity.   Past Medical History:  Diagnosis Date   Allergy Nsaids.   Asthma    Encounter for annual general medical examination with abnormal findings in adult 07/18/2023   Hyperlipidemia with target LDL less than 100 09/24/2023   Hypertension     Past Surgical History:  Procedure Laterality Date   BREAST SURGERY  2000   Lt intraductal papilloma    Family History  Problem Relation Age of Onset   Cancer Mother    Hypertension Mother    Cancer Father    Heart disease Father    Cancer Brother     Social History   Socioeconomic History   Marital status: Married    Spouse name: Not on file   Number of children: Not on file   Years of education: Not on file   Highest education level: Bachelor's degree (e.g., BA, AB, BS)  Occupational History   Not on file  Tobacco Use   Smoking status: Never   Smokeless tobacco: Never  Substance and Sexual Activity   Alcohol use: Yes    Alcohol/week: 2.0 standard drinks of alcohol    Types: 2 Glasses of wine per week   Drug use: Never   Sexual activity: Yes    Birth control/protection: Post-menopausal  Other Topics Concern   Not on  file  Social History Narrative   Not on file   Social Determinants of Health   Financial Resource Strain: Low Risk  (06/16/2023)   Overall Financial Resource Strain (CARDIA)    Difficulty of Paying Living Expenses: Not hard at all  Food Insecurity: No Food Insecurity (06/16/2023)   Hunger Vital Sign    Worried About Running Out of Food in the Last Year: Never true    Ran Out of Food in the Last Year: Never true  Transportation Needs: No Transportation Needs (06/16/2023)   PRAPARE - Administrator, Civil Service (Medical): No    Lack of Transportation (Non-Medical): No  Physical Activity: Sufficiently Active (06/16/2023)   Exercise Vital Sign    Days of Exercise per Week: 3 days    Minutes of Exercise per Session: 50 min  Stress: No Stress Concern Present (06/16/2023)   [employer] of Occupational Health - Occupational Stress Questionnaire    Feeling of Stress : Not at all  Social Connections: Socially Integrated (06/16/2023)   Social Connection and Isolation Panel [NHANES]    Frequency of Communication with Friends and Family: Twice a week    Frequency of Social Gatherings with Friends and Family: Twice a week    Attends Religious Services: 1 to 4 times per year    Active Member of [employer] or Organizations: Yes  Attends Banker Meetings: 1 to 4 times per year    Marital Status: Married  Catering manager Violence: Not on file    Outpatient Medications Prior to Visit  Medication Sig Dispense Refill   albuterol (VENTOLIN HFA) 108 (90 Base) MCG/ACT inhaler Inhale 2 puffs into the lungs every 6 (six) hours as needed for wheezing or shortness of breath. 8 g 0   Albuterol-Budesonide (AIRSUPRA) 90-80 MCG/ACT AERO Inhale 2 puffs into the lungs every 6 (six) hours as needed (SOB). 2 puffs of medicine is 1-dose. Do NOT use more than 6 dose (12 puffs) in 24 hrs period. Rinse your mouth with water, If available, after using AIRSUPRA to decrease the chance of getting a  fungal infection (thrush) in your mouth and throat. 5.7 g 2   baclofen (LIORESAL) 10 MG tablet TAKE 1 TABLET BY MOUTH AT BEDTIME 30 tablet 0   hydrOXYzine (VISTARIL) 25 MG capsule TAKE 1 CAPSULE BY MOUTH AT BEDTIME AS NEEDED 30 capsule 0   lisinopril (ZESTRIL) 10 MG tablet Take 1 tablet (10 mg total) by mouth daily. 30 tablet 0   montelukast (SINGULAIR) 10 MG tablet TAKE 1 TABLET BY MOUTH AT BEDTIME 30 tablet 0   Multiple Vitamin (MULTIVITAMIN) tablet Take 1 tablet by mouth daily.     rosuvastatin (CRESTOR) 10 MG tablet Take 1 tablet (10 mg total) by mouth daily. 90 tablet 3   vitamin B-12 (CYANOCOBALAMIN) 1000 MCG tablet Take 1,000 mcg by mouth daily.     vitamin E 1000 UNIT capsule Take 1,000 Units by mouth daily.     Zinc Sulfate (ZINC-220 PO) Take by mouth.     fluticasone-salmeterol (ADVAIR) 100-50 MCG/ACT AEPB Inhale 1 puff into the lungs daily.     HYDROcodone-acetaminophen (NORCO) 5-325 MG tablet Take 1 tablet by mouth every 6 (six) hours as needed for moderate pain. 20 tablet 0   No facility-administered medications prior to visit.    Allergies  Allergen Reactions   Nsaids Anaphylaxis    ROS Review of Systems  Constitutional:  Negative for chills and fever.  Eyes:  Negative for visual disturbance.  Respiratory:  Negative for chest tightness and shortness of breath.   Neurological:  Negative for dizziness and headaches.      Objective:    Physical Exam HENT:     Head: Normocephalic.     Mouth/Throat:     Mouth: Mucous membranes are moist.  Cardiovascular:     Rate and Rhythm: Normal rate.     Heart sounds: Normal heart sounds.  Pulmonary:     Effort: Pulmonary effort is normal.     Breath sounds: Normal breath sounds.  Neurological:     Mental Status: She is alert.     BP 120/77   Pulse 91   Ht 5\' 2"  (1.575 m)   Wt 111 lb 1.3 oz (50.4 kg)   SpO2 97%   BMI 20.32 kg/m  Wt Readings from Last 3 Encounters:  09/24/23 111 lb 1.3 oz (50.4 kg)  07/18/23 112 lb  0.6 oz (50.8 kg)  06/20/23 117 lb 1.3 oz (53.1 kg)    Lab Results  Component Value Date   TSH 1.780 06/11/2023   Lab Results  Component Value Date   WBC 5.7 06/11/2023   HGB 13.1 06/11/2023   HCT 38.6 06/11/2023   MCV 93 06/11/2023   PLT 297 06/11/2023   Lab Results  Component Value Date   NA 140 06/11/2023   K 4.5 06/11/2023  CO2 24 06/11/2023   GLUCOSE 88 06/11/2023   BUN 23 06/11/2023   CREATININE 0.69 06/11/2023   BILITOT 0.3 06/11/2023   ALKPHOS 73 06/11/2023   AST 20 06/11/2023   ALT 17 06/11/2023   PROT 6.6 06/11/2023   ALBUMIN 4.1 06/11/2023   CALCIUM 9.3 06/11/2023   ANIONGAP 10 08/25/2022   EGFR 97 06/11/2023   Lab Results  Component Value Date   CHOL 281 (H) 06/11/2023   Lab Results  Component Value Date   HDL 60 06/11/2023   Lab Results  Component Value Date   LDLCALC 177 (H) 06/11/2023   Lab Results  Component Value Date   TRIG 234 (H) 06/11/2023   Lab Results  Component Value Date   CHOLHDL 4.7 (H) 06/11/2023   Lab Results  Component Value Date   HGBA1C 5.9 (H) 06/11/2023      Assessment & Plan:  Primary hypertension Assessment & Plan: Controlled Encouraged lisinopril 10 mg daily Low-sodium diet with increased physical activity encouraged BP Readings from Last 3 Encounters:  09/24/23 120/77  07/18/23 131/86  06/20/23 122/73      Orders: -     TSH + free T4 -     Lipid panel -     CMP14+EGFR  Hyperlipidemia with target LDL less than 100 Assessment & Plan: Will consider initiating ezetimibe 10 mg daily based on her lab results. Encouraged to continue a heart-healthy diet and increase physical activity.   Lab Results  Component Value Date   CHOL 281 (H) 06/11/2023   HDL 60 06/11/2023   LDLCALC 177 (H) 06/11/2023   TRIG 234 (H) 06/11/2023   CHOLHDL 4.7 (H) 06/11/2023      IFG (impaired fasting glucose) -     Hemoglobin A1c  Vitamin D deficiency -     VITAMIN D 25 Hydroxy (Vit-D Deficiency, Fractures)  Other  hyperlipidemia -     CBC with Differential/Platelet  Note: This chart has been completed using Engineer, civil (consulting) software, and while attempts have been made to ensure accuracy, certain words and phrases may not be transcribed as intended.    Follow-up: Return in about 4 months (around 01/25/2024).   Gilmore Laroche, FNP

## 2023-09-24 NOTE — Patient Instructions (Addendum)
I appreciate the opportunity to provide care to you today!    Follow up:  4 months  Labs: please stop by the lab during the week to get your blood drawn (CBC, CMP, TSH, Lipid profile, HgA1c, Vit D)   Here are some foods to avoid or reduce in your diet to help manage cholesterol levels:  Fried Foods:Deep-fried items such as french fries, fried chicken, and fried snacks are high in unhealthy fats and can raise LDL (bad) cholesterol levels. Processed Meats:Foods like bacon, sausage, hot dogs, and deli meats are often high in saturated fat and cholesterol. Full-Fat Dairy Products:Whole milk, full-fat yogurt, butter, cream, and cheese are rich in saturated fats, which can increase cholesterol levels. Baked Goods and Sweets:Pastries, cakes, cookies, and donuts often contain trans fats and added sugars, which can raise LDL cholesterol and lower HDL (good) cholesterol. Red Meat:Beef, lamb, and pork are high in saturated fat. Lean cuts or plant-based protein alternatives are better options. Lard and Shortening:Used in some baked goods, lard and shortening are high in trans fats and should be avoided. Fast Food:Many fast food items are cooked with unhealthy oils and contain high amounts of saturated and trans fats. Processed Snacks:Chips, crackers, and certain microwave popcorns can contain trans fats and high levels of unhealthy oils. Shellfish:While nutritious in other ways, some shellfish like shrimp, lobster, and crab are high in cholesterol. They should be consumed in moderation. Coconut and Palm Oils:these oils are high in saturated fat and can raise cholesterol levels when used in cooking or baking.      Please continue to a heart-healthy diet and increase your physical activities. Try to exercise for at least five days a week.    It was a pleasure to see you and I look forward to continuing to work together on your health and well-being. Please do not hesitate to call the office if you  need care or have questions about your care.  In case of emergency, please visit the Emergency Department for urgent care, or contact our clinic at [phone] to schedule an appointment. We're here to help you!   Have a wonderful day and week. With Gratitude, Gilmore Laroche MSN, FNP-BC

## 2023-09-25 ENCOUNTER — Other Ambulatory Visit (HOSPITAL_COMMUNITY)
Admission: RE | Admit: 2023-09-25 | Discharge: 2023-09-25 | Disposition: A | Discharge status: Home / Self Care | Payer: 59 | Source: Ambulatory Visit | Attending: Family Medicine | Admitting: Family Medicine

## 2023-09-25 DIAGNOSIS — I10 Essential (primary) hypertension: Secondary | ICD-10-CM | POA: Diagnosis not present

## 2023-09-25 DIAGNOSIS — E559 Vitamin D deficiency, unspecified: Secondary | ICD-10-CM | POA: Insufficient documentation

## 2023-09-25 DIAGNOSIS — Z006 Encounter for examination for normal comparison and control in clinical research program: Secondary | ICD-10-CM | POA: Insufficient documentation

## 2023-09-25 DIAGNOSIS — E7849 Other hyperlipidemia: Secondary | ICD-10-CM | POA: Insufficient documentation

## 2023-09-25 DIAGNOSIS — R7301 Impaired fasting glucose: Secondary | ICD-10-CM | POA: Insufficient documentation

## 2023-09-25 LAB — CBC WITH DIFFERENTIAL/PLATELET
Abs Immature Granulocytes: 0.01 10*3/uL (ref 0.00–0.07)
Basophils Absolute: 0.1 10*3/uL (ref 0.0–0.1)
Basophils Relative: 1 %
Eosinophils Absolute: 0.1 10*3/uL (ref 0.0–0.5)
Eosinophils Relative: 2 %
HCT: 43.8 % (ref 36.0–46.0)
Hemoglobin: 14.6 g/dL (ref 12.0–15.0)
Immature Granulocytes: 0 %
Lymphocytes Relative: 48 %
Lymphs Abs: 2 10*3/uL (ref 0.7–4.0)
MCH: 30.7 pg (ref 26.0–34.0)
MCHC: 33.3 g/dL (ref 30.0–36.0)
MCV: 92.2 fL (ref 80.0–100.0)
Monocytes Absolute: 0.3 10*3/uL (ref 0.1–1.0)
Monocytes Relative: 7 %
Neutro Abs: 1.8 10*3/uL (ref 1.7–7.7)
Neutrophils Relative %: 42 %
Platelets: 291 10*3/uL (ref 150–400)
RBC: 4.75 MIL/uL (ref 3.87–5.11)
RDW: 12.7 % (ref 11.5–15.5)
WBC: 4.3 10*3/uL (ref 4.0–10.5)
nRBC: 0 % (ref 0.0–0.2)

## 2023-09-25 LAB — LIPID PANEL
Cholesterol: 259 mg/dL — ABNORMAL HIGH (ref 0–200)
HDL: 60 mg/dL (ref >40)
LDL Cholesterol: 173 mg/dL — ABNORMAL HIGH (ref 0–99)
Total CHOL/HDL Ratio: 4.3 {ratio}
Triglycerides: 130 mg/dL (ref <150)
VLDL: 26 mg/dL (ref 0–40)

## 2023-09-25 LAB — COMPREHENSIVE METABOLIC PANEL
ALT: 337 U/L — ABNORMAL HIGH (ref 0–44)
AST: 98 U/L — ABNORMAL HIGH (ref 15–41)
Albumin: 4.4 g/dL (ref 3.5–5.0)
Alkaline Phosphatase: 81 U/L (ref 38–126)
Anion gap: 9 (ref 5–15)
BUN: 17 mg/dL (ref 8–23)
CO2: 28 mmol/L (ref 22–32)
Calcium: 9.2 mg/dL (ref 8.9–10.3)
Chloride: 98 mmol/L (ref 98–111)
Creatinine, Ser: 0.68 mg/dL (ref 0.44–1.00)
GFR, Estimated: >60 mL/min (ref >60)
Glucose, Bld: 91 mg/dL (ref 70–99)
Potassium: 4.1 mmol/L (ref 3.5–5.1)
Sodium: 135 mmol/L (ref 135–145)
Total Bilirubin: 1 mg/dL (ref 0.3–1.2)
Total Protein: 7.5 g/dL (ref 6.5–8.1)

## 2023-09-25 LAB — VITAMIN D 25 HYDROXY (VIT D DEFICIENCY, FRACTURES): Vit D, 25-Hydroxy: 37.66 ng/mL (ref 30–100)

## 2023-09-25 LAB — HEMOGLOBIN A1C
Hgb A1c MFr Bld: 5.7 % — ABNORMAL HIGH (ref 4.8–5.6)
Mean Plasma Glucose: 116.89 mg/dL

## 2023-09-25 LAB — T4, FREE: Free T4: 1.22 ng/dL — ABNORMAL HIGH (ref 0.61–1.12)

## 2023-09-25 LAB — TSH: TSH: 0.856 u[IU]/mL (ref 0.350–4.500)

## 2023-09-26 ENCOUNTER — Other Ambulatory Visit: Payer: Self-pay | Admitting: Family Medicine

## 2023-09-26 DIAGNOSIS — E038 Other specified hypothyroidism: Secondary | ICD-10-CM

## 2023-09-26 DIAGNOSIS — R7401 Elevation of levels of liver transaminase levels: Secondary | ICD-10-CM

## 2023-09-30 ENCOUNTER — Other Ambulatory Visit: Payer: Self-pay | Admitting: Family Medicine

## 2023-09-30 DIAGNOSIS — M62838 Other muscle spasm: Secondary | ICD-10-CM

## 2023-09-30 DIAGNOSIS — G479 Sleep disorder, unspecified: Secondary | ICD-10-CM

## 2023-10-01 ENCOUNTER — Other Ambulatory Visit: Payer: Self-pay | Admitting: Family Medicine

## 2023-10-01 DIAGNOSIS — J4520 Mild intermittent asthma, uncomplicated: Secondary | ICD-10-CM

## 2023-10-03 ENCOUNTER — Other Ambulatory Visit (HOSPITAL_COMMUNITY)
Admission: RE | Admit: 2023-10-03 | Discharge: 2023-10-03 | Disposition: A | Discharge status: Home / Self Care | Payer: Self-pay | Source: Ambulatory Visit | Attending: Oncology | Admitting: Oncology

## 2023-10-03 DIAGNOSIS — Z006 Encounter for examination for normal comparison and control in clinical research program: Secondary | ICD-10-CM | POA: Insufficient documentation

## 2023-10-15 ENCOUNTER — Other Ambulatory Visit: Payer: Self-pay | Admitting: Family Medicine

## 2023-10-15 DIAGNOSIS — M62838 Other muscle spasm: Secondary | ICD-10-CM

## 2023-10-15 DIAGNOSIS — G479 Sleep disorder, unspecified: Secondary | ICD-10-CM

## 2023-10-15 LAB — HELIX MOLECULAR SCREEN: Genetic Analysis Overall Interpretation: NEGATIVE

## 2023-10-15 LAB — GENECONNECT MOLECULAR SCREEN

## 2023-10-16 ENCOUNTER — Other Ambulatory Visit (HOSPITAL_COMMUNITY): Payer: Self-pay

## 2023-10-16 ENCOUNTER — Other Ambulatory Visit (HOSPITAL_COMMUNITY): Payer: Self-pay | Admitting: Family Medicine

## 2023-10-16 ENCOUNTER — Encounter: Payer: Self-pay | Admitting: Family Medicine

## 2023-10-16 DIAGNOSIS — Z1231 Encounter for screening mammogram for malignant neoplasm of breast: Secondary | ICD-10-CM

## 2023-10-16 MED ORDER — LISINOPRIL 10 MG PO TABS
10.0000 mg | ORAL_TABLET | Freq: Every day | ORAL | 0 refills | Status: DC
  Filled 2023-10-16: qty 30, 30d supply, fill #0

## 2023-10-17 ENCOUNTER — Ambulatory Visit (HOSPITAL_COMMUNITY)
Admission: RE | Admit: 2023-10-17 | Discharge: 2023-10-17 | Disposition: A | Discharge status: Home / Self Care | Payer: 59 | Source: Ambulatory Visit | Attending: Family Medicine | Admitting: Family Medicine

## 2023-10-17 ENCOUNTER — Other Ambulatory Visit: Payer: Self-pay | Admitting: Family Medicine

## 2023-10-17 ENCOUNTER — Encounter (HOSPITAL_COMMUNITY): Payer: Self-pay

## 2023-10-17 DIAGNOSIS — Z1231 Encounter for screening mammogram for malignant neoplasm of breast: Secondary | ICD-10-CM | POA: Insufficient documentation

## 2023-10-17 DIAGNOSIS — M62838 Other muscle spasm: Secondary | ICD-10-CM

## 2023-11-18 ENCOUNTER — Other Ambulatory Visit: Payer: Self-pay | Admitting: Family Medicine

## 2023-11-18 DIAGNOSIS — G479 Sleep disorder, unspecified: Secondary | ICD-10-CM

## 2023-11-18 DIAGNOSIS — J4520 Mild intermittent asthma, uncomplicated: Secondary | ICD-10-CM

## 2023-11-19 ENCOUNTER — Other Ambulatory Visit (HOSPITAL_COMMUNITY): Payer: Self-pay

## 2023-11-19 MED ORDER — LISINOPRIL 10 MG PO TABS
10.0000 mg | ORAL_TABLET | Freq: Every day | ORAL | 0 refills | Status: DC
  Filled 2023-11-19: qty 30, 30d supply, fill #0

## 2023-12-13 ENCOUNTER — Other Ambulatory Visit: Payer: Self-pay | Admitting: Family Medicine

## 2023-12-13 DIAGNOSIS — M62838 Other muscle spasm: Secondary | ICD-10-CM

## 2023-12-13 DIAGNOSIS — G479 Sleep disorder, unspecified: Secondary | ICD-10-CM

## 2023-12-13 DIAGNOSIS — J4520 Mild intermittent asthma, uncomplicated: Secondary | ICD-10-CM

## 2023-12-14 MED ORDER — BACLOFEN 10 MG PO TABS: 10.0000 mg | ORAL_TABLET | Freq: Every day | ORAL | 0 refills | Status: DC

## 2023-12-14 MED ORDER — LISINOPRIL 10 MG PO TABS: 10.0000 mg | ORAL_TABLET | Freq: Every day | ORAL | 0 refills | Status: DC

## 2023-12-14 MED ORDER — HYDROXYZINE PAMOATE 25 MG PO CAPS: 25.0000 mg | ORAL_CAPSULE | Freq: Every evening | ORAL | 0 refills | Status: DC | PRN

## 2023-12-14 MED ORDER — MONTELUKAST SODIUM 10 MG PO TABS: 10.0000 mg | ORAL_TABLET | Freq: Every day | ORAL | 0 refills | Status: DC

## 2024-01-13 ENCOUNTER — Other Ambulatory Visit: Payer: Self-pay | Admitting: Family Medicine

## 2024-01-13 DIAGNOSIS — G479 Sleep disorder, unspecified: Secondary | ICD-10-CM

## 2024-01-14 ENCOUNTER — Other Ambulatory Visit: Payer: Self-pay | Admitting: Family Medicine

## 2024-01-14 DIAGNOSIS — M62838 Other muscle spasm: Secondary | ICD-10-CM

## 2024-01-19 ENCOUNTER — Other Ambulatory Visit: Payer: Self-pay | Admitting: Family Medicine

## 2024-01-19 DIAGNOSIS — J4520 Mild intermittent asthma, uncomplicated: Secondary | ICD-10-CM

## 2024-01-21 ENCOUNTER — Other Ambulatory Visit: Payer: Self-pay | Admitting: Family Medicine

## 2024-01-21 ENCOUNTER — Other Ambulatory Visit: Payer: Self-pay

## 2024-01-21 ENCOUNTER — Other Ambulatory Visit (HOSPITAL_COMMUNITY): Payer: Self-pay

## 2024-01-21 DIAGNOSIS — J4520 Mild intermittent asthma, uncomplicated: Secondary | ICD-10-CM

## 2024-01-21 DIAGNOSIS — I10 Essential (primary) hypertension: Secondary | ICD-10-CM

## 2024-01-21 MED ORDER — MONTELUKAST SODIUM 10 MG PO TABS: 10.0000 mg | ORAL_TABLET | Freq: Every day | ORAL | 0 refills | Status: DC

## 2024-01-21 MED ORDER — LISINOPRIL 10 MG PO TABS: 10.0000 mg | ORAL_TABLET | Freq: Every day | ORAL | 1 refills | Status: DC

## 2024-01-21 MED ORDER — LISINOPRIL 10 MG PO TABS
10.0000 mg | ORAL_TABLET | Freq: Every day | ORAL | 0 refills | Status: DC
  Filled 2024-01-21: qty 30, 30d supply, fill #0

## 2024-01-21 MED ORDER — MONTELUKAST SODIUM 10 MG PO TABS
10.0000 mg | ORAL_TABLET | Freq: Every day | ORAL | 0 refills | Status: DC
  Filled 2024-01-21: qty 30, 30d supply, fill #0

## 2024-01-22 ENCOUNTER — Other Ambulatory Visit: Payer: Self-pay

## 2024-01-23 ENCOUNTER — Ambulatory Visit: Payer: 59 | Admitting: Family Medicine

## 2024-02-04 ENCOUNTER — Ambulatory Visit (INDEPENDENT_AMBULATORY_CARE_PROVIDER_SITE_OTHER): Payer: Commercial Managed Care - PPO | Admitting: Family Medicine

## 2024-02-04 ENCOUNTER — Encounter: Payer: Self-pay | Admitting: Family Medicine

## 2024-02-04 VITALS — BP 143/79 | HR 94 | Ht 62.0 in | Wt 108.0 lb

## 2024-02-04 DIAGNOSIS — I10 Essential (primary) hypertension: Secondary | ICD-10-CM

## 2024-02-04 DIAGNOSIS — J4520 Mild intermittent asthma, uncomplicated: Secondary | ICD-10-CM | POA: Diagnosis not present

## 2024-02-04 DIAGNOSIS — M62838 Other muscle spasm: Secondary | ICD-10-CM

## 2024-02-04 DIAGNOSIS — G479 Sleep disorder, unspecified: Secondary | ICD-10-CM

## 2024-02-04 DIAGNOSIS — R7301 Impaired fasting glucose: Secondary | ICD-10-CM | POA: Diagnosis not present

## 2024-02-04 DIAGNOSIS — E7849 Other hyperlipidemia: Secondary | ICD-10-CM

## 2024-02-04 DIAGNOSIS — E038 Other specified hypothyroidism: Secondary | ICD-10-CM

## 2024-02-04 DIAGNOSIS — E559 Vitamin D deficiency, unspecified: Secondary | ICD-10-CM | POA: Diagnosis not present

## 2024-02-04 DIAGNOSIS — G43009 Migraine without aura, not intractable, without status migrainosus: Secondary | ICD-10-CM

## 2024-02-04 MED ORDER — HYDROXYZINE PAMOATE 25 MG PO CAPS
25.0000 mg | ORAL_CAPSULE | Freq: Every evening | ORAL | 0 refills | Status: DC | PRN
Start: 2024-02-04 — End: 2024-04-21

## 2024-02-04 MED ORDER — LISINOPRIL 10 MG PO TABS: 10.0000 mg | ORAL_TABLET | Freq: Every day | ORAL | 2 refills | Status: DC

## 2024-02-04 MED ORDER — MONTELUKAST SODIUM 10 MG PO TABS
10.0000 mg | ORAL_TABLET | Freq: Every day | ORAL | 1 refills | Status: DC
Start: 2024-02-04 — End: 2024-09-08

## 2024-02-04 MED ORDER — BUTALBITAL-APAP-CAFFEINE 50-325-40 MG PO TABS
1.0000 | ORAL_TABLET | Freq: Four times a day (QID) | ORAL | 0 refills | Status: DC | PRN
Start: 2024-02-04 — End: 2024-02-07

## 2024-02-04 MED ORDER — BACLOFEN 10 MG PO TABS
10.0000 mg | ORAL_TABLET | Freq: Every day | ORAL | 1 refills | Status: DC
Start: 2024-02-04 — End: 2024-04-21

## 2024-02-04 NOTE — Patient Instructions (Addendum)
 I appreciate the opportunity to provide care to you today!    Follow up:  4 months  Fasting Labs: please stop by the lab during the week to get your blood drawn (CBC, CMP, TSH, Lipid profile, HgA1c, Vit D)  Please seek immediate medical attention at the Emergency Department if you experience any of the following symptoms in conjunction with your headaches:  Acute onset of a severe headache described as "the worst headache of my life," particularly in a patient with no prior history of headaches. Unrelenting headaches that do not improve with conservative treatments or pain that progressively worsens. Lancinating or "ice-pick" pain. Severe headaches accompanied by a stiff neck and/or fever. Headaches associated with changes in mental status or level of consciousness. Persistent headaches following trauma to the head or neck. A significant change in pattern or severity of headaches in a patient with a longstanding history of chronic headaches. Your prompt evaluation is crucial for appropriate diagnosis and management.   Here are some foods to avoid or reduce in your diet to help manage cholesterol levels:  Fried Foods:Deep-fried items such as french fries, fried chicken, and fried snacks are high in unhealthy fats and can raise LDL (bad) cholesterol levels. Processed Meats:Foods like bacon, sausage, hot dogs, and deli meats are often high in saturated fat and cholesterol. Full-Fat Dairy Products:Whole milk, full-fat yogurt, butter, cream, and cheese are rich in saturated fats, which can increase cholesterol levels. Baked Goods and Sweets:Pastries, cakes, cookies, and donuts often contain trans fats and added sugars, which can raise LDL cholesterol and lower HDL (good) cholesterol. Red Meat:Beef, lamb, and pork are high in saturated fat. Lean cuts or plant-based protein alternatives are better options. Lard and Shortening:Used in some baked goods, lard and shortening are high in trans fats and  should be avoided. Fast Food:Many fast food items are cooked with unhealthy oils and contain high amounts of saturated and trans fats. Processed Snacks:Chips, crackers, and certain microwave popcorns can contain trans fats and high levels of unhealthy oils. Shellfish:While nutritious in other ways, some shellfish like shrimp, lobster, and crab are high in cholesterol. They should be consumed in moderation. Coconut and Palm Oils:these oils are high in saturated fat and can raise cholesterol levels when used in cooking or baking.     Attached with your AVS, you will find valuable resources for self-education. I highly recommend dedicating some time to thoroughly examine them.   Please continue to a heart-healthy diet and increase your physical activities. Try to exercise for at least five days a week.    It was a pleasure to see you and I look forward to continuing to work together on your health and well-being. Please do not hesitate to call the office if you need care or have questions about your care.  In case of emergency, please visit the Emergency Department for urgent care, or contact our clinic at [phone] to schedule an appointment. We're here to help you!   Have a wonderful day and week. With Gratitude, Gilmore Laroche MSN, FNP-BC

## 2024-02-04 NOTE — Progress Notes (Unsigned)
 Established Patient Office Visit  Subjective:  Patient ID: Dawn Arellano, female    DOB: 09-16-1961  Age: 63 y.o. MRN: [id number]  CC:  Chief Complaint  Patient presents with   Follow-up    Headaches on the lft side of head every day. Causing eye water and nausea Not sleeping  Out of B/P meds      HPI Dawn Arellano is a 63 y.o. female with past medical history of HTN, migraines and hyperlipidemia presents for f/u of  chronic medical conditions. For the details of today's visit, please refer to the assessment and plan.     Past Medical History:  Diagnosis Date   Allergy Nsaids.   Asthma    Encounter for annual general medical examination with abnormal findings in adult 07/18/2023   Hyperlipidemia with target LDL less than 100 09/24/2023   Hypertension     Past Surgical History:  Procedure Laterality Date   BREAST SURGERY Right 2000   right intraductal papilloma    Family History  Problem Relation Age of Onset   Cancer Mother    Hypertension Mother    Cancer Father    Heart disease Father    Cancer Brother     Social History   Socioeconomic History   Marital status: Married    Spouse name: Not on file   Number of children: Not on file   Years of education: Not on file   Highest education level: Bachelor's degree (e.g., BA, AB, BS)  Occupational History   Not on file  Tobacco Use   Smoking status: Never   Smokeless tobacco: Never  Substance and Sexual Activity   Alcohol use: Yes    Alcohol/week: 2.0 standard drinks of alcohol    Types: 2 Glasses of wine per week   Drug use: Never   Sexual activity: Yes    Birth control/protection: Post-menopausal  Other Topics Concern   Not on file  Social History Narrative   Not on file   Social Drivers of Health   Financial Resource Strain: Low Risk  (06/16/2023)   Overall Financial Resource Strain (CARDIA)    Difficulty of Paying Living Expenses: Not hard at all  Food Insecurity: No Food Insecurity (06/16/2023)    Hunger Vital Sign    Worried About Running Out of Food in the Last Year: Never true    Ran Out of Food in the Last Year: Never true  Transportation Needs: No Transportation Needs (06/16/2023)   PRAPARE - Administrator, Civil Service (Medical): No    Lack of Transportation (Non-Medical): No  Physical Activity: Sufficiently Active (06/16/2023)   Exercise Vital Sign    Days of Exercise per Week: 3 days    Minutes of Exercise per Session: 50 min  Stress: No Stress Concern Present (06/16/2023)   [employer] of Occupational Health - Occupational Stress Questionnaire    Feeling of Stress : Not at all  Social Connections: Socially Integrated (06/16/2023)   Social Connection and Isolation Panel [NHANES]    Frequency of Communication with Friends and Family: Twice a week    Frequency of Social Gatherings with Friends and Family: Twice a week    Attends Religious Services: 1 to 4 times per year    Active Member of [employer] or Organizations: Yes    Attends Banker Meetings: 1 to 4 times per year    Marital Status: Married  Catering manager Violence: Not on file    Outpatient Medications Prior to Visit  Medication Sig Dispense Refill   albuterol (VENTOLIN HFA) 108 (90 Base) MCG/ACT inhaler Inhale 2 puffs into the lungs every 6 (six) hours as needed for wheezing or shortness of breath. 8 g 0   Albuterol-Budesonide (AIRSUPRA) 90-80 MCG/ACT AERO Inhale 2 puffs into the lungs every 6 (six) hours as needed (SOB). 2 puffs of medicine is 1-dose. Do NOT use more than 6 dose (12 puffs) in 24 hrs period. Rinse your mouth with water, If available, after using AIRSUPRA to decrease the chance of getting a fungal infection (thrush) in your mouth and throat. 5.7 g 2   montelukast (SINGULAIR) 10 MG tablet Take 1 tablet (10 mg total) by mouth at bedtime. 30 tablet 0   Multiple Vitamin (MULTIVITAMIN) tablet Take 1 tablet by mouth daily.     rosuvastatin (CRESTOR) 10 MG tablet Take 1 tablet (10 mg  total) by mouth daily. 90 tablet 3   vitamin B-12 (CYANOCOBALAMIN) 1000 MCG tablet Take 1,000 mcg by mouth daily.     vitamin E 1000 UNIT capsule Take 1,000 Units by mouth daily.     Zinc Sulfate (ZINC-220 PO) Take by mouth.     baclofen (LIORESAL) 10 MG tablet TAKE 1 TABLET BY MOUTH AT BEDTIME 30 tablet 0   hydrOXYzine (VISTARIL) 25 MG capsule TAKE 1 CAPSULE BY MOUTH AT BEDTIME AS NEEDED 30 capsule 0   lisinopril (ZESTRIL) 10 MG tablet Take 1 tablet (10 mg total) by mouth daily. 90 tablet 1   montelukast (SINGULAIR) 10 MG tablet Take 1 tablet (10 mg total) by mouth at bedtime. 30 tablet 0   fluticasone-salmeterol (ADVAIR) 100-50 MCG/ACT AEPB Inhale 1 puff into the lungs daily.     HYDROcodone-acetaminophen (NORCO) 5-325 MG tablet Take 1 tablet by mouth every 6 (six) hours as needed for moderate pain. 20 tablet 0   No facility-administered medications prior to visit.    Allergies  Allergen Reactions   Nsaids Anaphylaxis    ROS Review of Systems  Constitutional:  Negative for chills and fever.  Eyes:  Negative for visual disturbance.  Respiratory:  Negative for chest tightness and shortness of breath.   Neurological:  Positive for headaches. Negative for dizziness.      Objective:    Physical Exam HENT:     Head: Normocephalic.     Mouth/Throat:     Mouth: Mucous membranes are moist.  Cardiovascular:     Rate and Rhythm: Normal rate.     Heart sounds: Normal heart sounds.  Pulmonary:     Effort: Pulmonary effort is normal.     Breath sounds: Normal breath sounds.  Neurological:     Mental Status: She is alert.     BP (!) 143/79   Pulse 94   Ht 5\' 2"  (1.575 m)   Wt 108 lb 0.6 oz (49 kg)   SpO2 98%   BMI 19.76 kg/m  Wt Readings from Last 3 Encounters:  02/04/24 108 lb 0.6 oz (49 kg)  09/24/23 111 lb 1.3 oz (50.4 kg)  07/18/23 112 lb 0.6 oz (50.8 kg)    Lab Results  Component Value Date   TSH 0.856 09/25/2023   Lab Results  Component Value Date   WBC 4.3  09/25/2023   HGB 14.6 09/25/2023   HCT 43.8 09/25/2023   MCV 92.2 09/25/2023   PLT 291 09/25/2023   Lab Results  Component Value Date   NA 135 09/25/2023   K 4.1 09/25/2023   CO2 28 09/25/2023   GLUCOSE 91  09/25/2023   BUN 17 09/25/2023   CREATININE 0.68 09/25/2023   BILITOT 1.0 09/25/2023   ALKPHOS 81 09/25/2023   AST 98 (H) 09/25/2023   ALT 337 (H) 09/25/2023   PROT 7.5 09/25/2023   ALBUMIN 4.4 09/25/2023   CALCIUM 9.2 09/25/2023   ANIONGAP 9 09/25/2023   EGFR 97 06/11/2023   Lab Results  Component Value Date   CHOL 259 (H) 09/25/2023   Lab Results  Component Value Date   HDL 60 09/25/2023   Lab Results  Component Value Date   LDLCALC 173 (H) 09/25/2023   Lab Results  Component Value Date   TRIG 130 09/25/2023   Lab Results  Component Value Date   CHOLHDL 4.3 09/25/2023   Lab Results  Component Value Date   HGBA1C 5.7 (H) 09/25/2023      Assessment & Plan:  Primary hypertension Assessment & Plan: BP controlled in the clinic. The patient reports being out of her BP medication for two weeks. She complains of a unilateral headache, notable behind her left eye. She denies that this is the worst headache of her life and reports no blurred vision, dizziness, or chest pain. No nausea or vomiting reported.  Refilled Lisinopril 10 mg today to resume therapy. Encouraged a low-sodium diet with increased physical activity. The patient previously took Fioricet for her headaches with relief, and therapy will be reinstated today. Encouraged to follow up if her symptoms worsen or fail to improve with the current regimen.    Orders: -     Lisinopril; Take 1 tablet (10 mg total) by mouth daily.  Dispense: 90 tablet; Refill: 2  Migraine without aura and without status migrainosus, not intractable Assessment & Plan: The patient has a history of migraine headaches, previously treated with Fioricet. Will reinstate therapy today. She also noted a history of two  sinus surgeries due to frequent sinus infections. The patient reports breathing well through her nostrils and denies any blockage or congestion. If headaches are unrelieved with the current regimen, a referral to ENT may be considered.    Orders: -     Butalbital-APAP-Caffeine; Take 1 tablet by mouth every 6 (six) hours as needed for headache.  Dispense: 14 tablet; Refill: 0  Sleep disturbance Assessment & Plan: Refill sent  Orders: -     hydrOXYzine Pamoate; Take 1 capsule (25 mg total) by mouth at bedtime as needed.  Dispense: 90 capsule; Refill: 0  Mild intermittent asthma without complication -     Montelukast Sodium; Take 1 tablet (10 mg total) by mouth at bedtime.  Dispense: 90 tablet; Refill: 1  Muscle spasm -     Baclofen; Take 1 tablet (10 mg total) by mouth at bedtime.  Dispense: 60 tablet; Refill: 1  IFG (impaired fasting glucose) -     Hemoglobin A1c  Vitamin D deficiency -     VITAMIN D 25 Hydroxy (Vit-D Deficiency, Fractures)  TSH (thyroid-stimulating hormone deficiency) -     TSH + free T4  Other hyperlipidemia -     Lipid panel -     CMP14+EGFR -     CBC with Differential/Platelet   Note: This chart has been completed using Engineer, civil (consulting) software, and while attempts have been made to ensure accuracy, certain words and phrases may not be transcribed as intended.   Follow-up: Return in about 4 months (around 06/03/2024).   Gilmore Laroche, FNP

## 2024-02-06 ENCOUNTER — Other Ambulatory Visit: Payer: Self-pay | Admitting: Family Medicine

## 2024-02-06 ENCOUNTER — Encounter: Payer: Self-pay | Admitting: Family Medicine

## 2024-02-06 DIAGNOSIS — G43009 Migraine without aura, not intractable, without status migrainosus: Secondary | ICD-10-CM | POA: Insufficient documentation

## 2024-02-06 DIAGNOSIS — G479 Sleep disorder, unspecified: Secondary | ICD-10-CM

## 2024-02-06 NOTE — Assessment & Plan Note (Signed)
 BP controlled in the clinic. The patient reports being out of her BP medication for two weeks. She complains of a unilateral headache, notable behind her left eye. She denies that this is the worst headache of her life and reports no blurred vision, dizziness, or chest pain. No nausea or vomiting reported.  Refilled Lisinopril 10 mg today to resume therapy. Encouraged a low-sodium diet with increased physical activity. The patient previously took Fioricet for her headaches with relief, and therapy will be reinstated today. Encouraged to follow up if her symptoms worsen or fail to improve with the current regimen.

## 2024-02-06 NOTE — Assessment & Plan Note (Addendum)
 The patient has a history of migraine headaches, previously treated with Fioricet. Will reinstate therapy today. She also noted a history of two sinus surgeries due to frequent sinus infections. The patient reports breathing well through her nostrils and denies any blockage or congestion. If headaches are unrelieved with the current regimen, a referral to ENT may be considered.

## 2024-02-06 NOTE — Assessment & Plan Note (Signed)
 Refill sent.

## 2024-02-07 ENCOUNTER — Other Ambulatory Visit: Payer: Self-pay | Admitting: Family Medicine

## 2024-02-07 DIAGNOSIS — G43009 Migraine without aura, not intractable, without status migrainosus: Secondary | ICD-10-CM

## 2024-02-07 MED ORDER — BUTALBITAL-APAP-CAFFEINE 50-325-40 MG PO TABS: 1.0000 | ORAL_TABLET | Freq: Four times a day (QID) | ORAL | 0 refills | Status: DC | PRN

## 2024-02-07 NOTE — Telephone Encounter (Signed)
 Rx sent

## 2024-02-20 ENCOUNTER — Other Ambulatory Visit (HOSPITAL_COMMUNITY)
Admission: RE | Admit: 2024-02-20 | Discharge: 2024-02-20 | Disposition: A | Discharge status: Home / Self Care | Source: Ambulatory Visit | Attending: Family Medicine | Admitting: Family Medicine

## 2024-02-20 DIAGNOSIS — E7849 Other hyperlipidemia: Secondary | ICD-10-CM | POA: Diagnosis not present

## 2024-02-20 DIAGNOSIS — R7301 Impaired fasting glucose: Secondary | ICD-10-CM | POA: Diagnosis not present

## 2024-02-20 DIAGNOSIS — E038 Other specified hypothyroidism: Secondary | ICD-10-CM | POA: Insufficient documentation

## 2024-02-20 DIAGNOSIS — E559 Vitamin D deficiency, unspecified: Secondary | ICD-10-CM | POA: Insufficient documentation

## 2024-02-20 LAB — CBC WITH DIFFERENTIAL/PLATELET
Abs Immature Granulocytes: 0.01 10*3/uL (ref 0.00–0.07)
Basophils Absolute: 0.1 10*3/uL (ref 0.0–0.1)
Basophils Relative: 1 %
Eosinophils Absolute: 0.1 10*3/uL (ref 0.0–0.5)
Eosinophils Relative: 1 %
HCT: 40.1 % (ref 36.0–46.0)
Hemoglobin: 13.4 g/dL (ref 12.0–15.0)
Immature Granulocytes: 0 %
Lymphocytes Relative: 31 %
Lymphs Abs: 1.5 10*3/uL (ref 0.7–4.0)
MCH: 30.9 pg (ref 26.0–34.0)
MCHC: 33.4 g/dL (ref 30.0–36.0)
MCV: 92.6 fL (ref 80.0–100.0)
Monocytes Absolute: 0.4 10*3/uL (ref 0.1–1.0)
Monocytes Relative: 8 %
Neutro Abs: 2.8 10*3/uL (ref 1.7–7.7)
Neutrophils Relative %: 59 %
Platelets: 309 10*3/uL (ref 150–400)
RBC: 4.33 MIL/uL (ref 3.87–5.11)
RDW: 12.9 % (ref 11.5–15.5)
WBC: 4.7 10*3/uL (ref 4.0–10.5)
nRBC: 0 % (ref 0.0–0.2)

## 2024-02-20 LAB — COMPREHENSIVE METABOLIC PANEL
ALT: 21 U/L (ref 0–44)
AST: 25 U/L (ref 15–41)
Albumin: 4.1 g/dL (ref 3.5–5.0)
Alkaline Phosphatase: 51 U/L (ref 38–126)
Anion gap: 9 (ref 5–15)
BUN: 13 mg/dL (ref 8–23)
CO2: 25 mmol/L (ref 22–32)
Calcium: 9.3 mg/dL (ref 8.9–10.3)
Chloride: 104 mmol/L (ref 98–111)
Creatinine, Ser: 0.66 mg/dL (ref 0.44–1.00)
GFR, Estimated: >60 mL/min (ref >60)
Glucose, Bld: 103 mg/dL — ABNORMAL HIGH (ref 70–99)
Potassium: 4 mmol/L (ref 3.5–5.1)
Sodium: 138 mmol/L (ref 135–145)
Total Bilirubin: 0.6 mg/dL (ref 0.0–1.2)
Total Protein: 6.8 g/dL (ref 6.5–8.1)

## 2024-02-20 LAB — LIPID PANEL
Cholesterol: 215 mg/dL — ABNORMAL HIGH (ref 0–200)
HDL: 72 mg/dL (ref >40)
LDL Cholesterol: 124 mg/dL — ABNORMAL HIGH (ref 0–99)
Total CHOL/HDL Ratio: 3 ratio
Triglycerides: 94 mg/dL (ref <150)
VLDL: 19 mg/dL (ref 0–40)

## 2024-02-20 LAB — HEMOGLOBIN A1C
Hgb A1c MFr Bld: 5.3 % (ref 4.8–5.6)
Mean Plasma Glucose: 105.41 mg/dL

## 2024-02-20 LAB — T4, FREE: Free T4: 1.09 ng/dL (ref 0.61–1.12)

## 2024-02-20 LAB — VITAMIN D 25 HYDROXY (VIT D DEFICIENCY, FRACTURES): Vit D, 25-Hydroxy: 46.71 ng/mL (ref 30–100)

## 2024-02-20 LAB — TSH: TSH: 0.987 u[IU]/mL (ref 0.350–4.500)

## 2024-02-23 ENCOUNTER — Encounter: Payer: Self-pay | Admitting: Family Medicine

## 2024-03-24 ENCOUNTER — Other Ambulatory Visit (HOSPITAL_COMMUNITY): Payer: Self-pay

## 2024-03-24 ENCOUNTER — Telehealth: Payer: Self-pay | Admitting: Pharmacy Technician

## 2024-03-24 NOTE — Telephone Encounter (Signed)
 Pharmacy Patient Advocate Encounter   Received notification from CoverMyMeds that prior authorization for Airsupra 90-80MCG/ACT aerosol is required/requested.   Insurance verification completed.   The patient is insured through [facility] .   Per test claim: The current 30 day co-pay is, $90.72.  No PA needed at this time. This test claim was processed through [facility]- copay amounts may vary at other pharmacies due to pharmacy/plan contracts, or as the patient moves through the different stages of their insurance plan.

## 2024-03-31 ENCOUNTER — Encounter: Payer: Self-pay | Admitting: Family Medicine

## 2024-04-01 ENCOUNTER — Other Ambulatory Visit (HOSPITAL_COMMUNITY): Payer: Self-pay

## 2024-04-01 ENCOUNTER — Other Ambulatory Visit: Payer: Self-pay

## 2024-04-02 ENCOUNTER — Other Ambulatory Visit (HOSPITAL_COMMUNITY): Payer: Self-pay

## 2024-04-02 ENCOUNTER — Other Ambulatory Visit: Payer: Self-pay

## 2024-04-02 DIAGNOSIS — J4520 Mild intermittent asthma, uncomplicated: Secondary | ICD-10-CM

## 2024-04-02 MED ORDER — AIRSUPRA 90-80 MCG/ACT IN AERO
2.0000 | INHALATION_SPRAY | Freq: Four times a day (QID) | RESPIRATORY_TRACT | 2 refills | Status: DC | PRN
  Filled 2024-04-02 – 2024-08-19 (×3): qty 10.7, 30d supply, fill #0
  Filled 2024-10-20 – 2024-10-21 (×2): qty 10.7, 30d supply, fill #1

## 2024-04-21 ENCOUNTER — Other Ambulatory Visit: Payer: Self-pay | Admitting: Family Medicine

## 2024-04-21 DIAGNOSIS — G479 Sleep disorder, unspecified: Secondary | ICD-10-CM

## 2024-04-21 DIAGNOSIS — M62838 Other muscle spasm: Secondary | ICD-10-CM

## 2024-05-22 ENCOUNTER — Other Ambulatory Visit: Payer: Self-pay

## 2024-05-22 ENCOUNTER — Encounter: Payer: Self-pay | Admitting: Family Medicine

## 2024-05-22 DIAGNOSIS — M2041 Other hammer toe(s) (acquired), right foot: Secondary | ICD-10-CM

## 2024-05-22 DIAGNOSIS — M21619 Bunion of unspecified foot: Secondary | ICD-10-CM

## 2024-05-22 NOTE — Telephone Encounter (Signed)
 Kindly place referral to podiatry

## 2024-05-31 ENCOUNTER — Other Ambulatory Visit: Payer: Self-pay | Admitting: Family Medicine

## 2024-05-31 DIAGNOSIS — M62838 Other muscle spasm: Secondary | ICD-10-CM

## 2024-06-02 ENCOUNTER — Ambulatory Visit: Payer: Commercial Managed Care - PPO | Admitting: Family Medicine

## 2024-06-22 ENCOUNTER — Other Ambulatory Visit: Payer: Self-pay | Admitting: Family Medicine

## 2024-06-22 DIAGNOSIS — G479 Sleep disorder, unspecified: Secondary | ICD-10-CM

## 2024-06-25 ENCOUNTER — Ambulatory Visit (INDEPENDENT_AMBULATORY_CARE_PROVIDER_SITE_OTHER)

## 2024-06-25 ENCOUNTER — Encounter: Payer: Self-pay | Admitting: Podiatry

## 2024-06-25 ENCOUNTER — Ambulatory Visit: Admitting: Podiatry

## 2024-06-25 VITALS — Ht 62.0 in | Wt 108.0 lb

## 2024-06-25 DIAGNOSIS — M21611 Bunion of right foot: Secondary | ICD-10-CM

## 2024-06-25 DIAGNOSIS — M21619 Bunion of unspecified foot: Secondary | ICD-10-CM

## 2024-06-25 DIAGNOSIS — M722 Plantar fascial fibromatosis: Secondary | ICD-10-CM

## 2024-06-25 DIAGNOSIS — M2041 Other hammer toe(s) (acquired), right foot: Secondary | ICD-10-CM | POA: Diagnosis not present

## 2024-06-25 MED ORDER — TRIAMCINOLONE ACETONIDE 10 MG/ML IJ SUSP
10.0000 mg | Freq: Once | INTRAMUSCULAR | Status: AC
Start: 2024-06-25 — End: 2024-06-25
  Administered 2024-06-25: 10 mg via INTRA_ARTICULAR

## 2024-06-27 NOTE — Progress Notes (Signed)
 Subjective:   Patient ID: Dawn Arellano, female   DOB: 63 y.o.   MRN: [id number]   HPI Patient presents with husband with significant structural malalignment of the forefoot right with painful bunion painful hammertoe deformity and inflammation of the heel of the more recent duration.  Does have probable family history of this and the structural bunion and hammertoe deformity have gotten worse over the last year   Review of Systems  All other systems reviewed and are negative.       Objective:  Physical Exam Vitals and nursing note reviewed.  Constitutional:      Appearance: She is well-developed.  Pulmonary:     Effort: Pulmonary effort is normal.  Musculoskeletal:        General: Normal range of motion.  Skin:    General: Skin is warm.  Neurological:     Mental Status: She is alert.     Neurovascular status intact muscle strength adequate range of motion adequate with patient found to have large hyperostosis medial aspect first metatarsal head right elevated second digit with rigid contracture and keratotic lesions formation lesser toes that are painful when pressed.  Acute discomfort into the plantar fascia right at the insertion tendon calcaneus with moderate depression of the arch.  Good digital perfusion well-oriented x 3     Assessment:  Acute fasciitis symptomatology right along with bunion deformity hammertoe deformity     Plan:  H&P reviewed all conditions and at this point I have recommended conservative treatment for the heel and that the foot will need to be corrected due to structural imbalances.  I did discuss that we can do a distal osteotomy which may not give full correction but I think will do for her well possible Katrina and digital corrections and I reviewed that and we will talk in more detail when she comes back as she is gena wait till September October for surgery.  I did do sterile prep injected the plantar fascia right 3 mg Kenalog  5 mg Xylocaine advised  on supportive therapy  X-rays indicate significant elevation of the intermetatarsal angle of around 15 degrees deviation hallux and significant rigid contracture of the lesser digits.  With distal dislocation of the 3rd and 4th toes.  Patient does have small spur heel no indication stress fracture arthritis

## 2024-06-30 ENCOUNTER — Telehealth: Payer: Self-pay | Admitting: Podiatry

## 2024-06-30 NOTE — Telephone Encounter (Signed)
 As per patient, she experienced an allergic reaction all over her body, patient took a benadryl pill and did not stop itching. Patient does not need any other medication. Just advised for this information to be placed in her chart.

## 2024-07-02 ENCOUNTER — Encounter: Payer: Self-pay | Admitting: Podiatry

## 2024-07-16 ENCOUNTER — Ambulatory Visit: Admitting: Podiatry

## 2024-07-21 ENCOUNTER — Ambulatory Visit: Admitting: Family Medicine

## 2024-07-21 ENCOUNTER — Encounter: Payer: Self-pay | Admitting: Family Medicine

## 2024-07-21 VITALS — BP 135/77 | HR 96 | Resp 16 | Ht 62.0 in | Wt 104.1 lb

## 2024-07-21 DIAGNOSIS — E7849 Other hyperlipidemia: Secondary | ICD-10-CM | POA: Diagnosis not present

## 2024-07-21 DIAGNOSIS — G479 Sleep disorder, unspecified: Secondary | ICD-10-CM

## 2024-07-21 DIAGNOSIS — I493 Ventricular premature depolarization: Secondary | ICD-10-CM | POA: Diagnosis not present

## 2024-07-21 DIAGNOSIS — E038 Other specified hypothyroidism: Secondary | ICD-10-CM | POA: Diagnosis not present

## 2024-07-21 DIAGNOSIS — I10 Essential (primary) hypertension: Secondary | ICD-10-CM

## 2024-07-21 DIAGNOSIS — E559 Vitamin D deficiency, unspecified: Secondary | ICD-10-CM | POA: Diagnosis not present

## 2024-07-21 DIAGNOSIS — R7301 Impaired fasting glucose: Secondary | ICD-10-CM | POA: Diagnosis not present

## 2024-07-21 MED ORDER — TRAZODONE HCL 50 MG PO TABS
50.0000 mg | ORAL_TABLET | Freq: Every day | ORAL | 3 refills | Status: DC
Start: 2024-07-21 — End: 2024-11-03

## 2024-07-21 NOTE — Assessment & Plan Note (Signed)
 Referral place to cardiology Encouraged to seek urgent care if experiencing any of these symptoms with her PVCs: Chest pain, pressure, or discomfort -Shortness of breath or difficulty breathing -Fainting, near-fainting, or sudden dizziness -Rapid, sustained, or irregular heartbeat that does not resolve -Weakness, lightheadedness, or confusion -New or worsening swelling in your legs, ankles, or feet -Any symptoms that occur with exercise or suddenly at rest

## 2024-07-21 NOTE — Patient Instructions (Addendum)
 I appreciate the opportunity to provide care to you today!    Follow up:  5 months  Labs: please stop by the lab during the week to get your blood drawn (CBC, CMP, TSH, Lipid profile, HgA1c, Vit D)  Sleep Disturbance -Start Trazodone  50 mg by mouth at bedtime. *Please contact me if you experience minimal relief after 4 weeks of taking the medication so we can discuss possible dose adjustments.  While PVCs are often harmless, you should seek urgent medical attention if you experience: -Chest pain, pressure, or discomfort -Shortness of breath or difficulty breathing -Fainting, near-fainting, or sudden dizziness -Rapid, sustained, or irregular heartbeat that does not resolve -Weakness, lightheadedness, or confusion -New or worsening swelling in your legs, ankles, or feet -Any symptoms that occur with exercise or suddenly at rest   Please follow up if your symptoms worsen or fail to improve.   Referrals today- Cardiology    Please continue to a heart-healthy diet and increase your physical activities. Try to exercise for at least five days a week.    It was a pleasure to see you and I look forward to continuing to work together on your health and well-being. Please do not hesitate to call the office if you need care or have questions about your care.  In case of emergency, please visit the Emergency Department for urgent care, or contact our clinic at [phone] to schedule an appointment. We're here to help you!   Have a wonderful day and week. With Gratitude, Peretz Thieme MSN, FNP-BC

## 2024-07-21 NOTE — Progress Notes (Signed)
 Established Patient Office Visit  Subjective:  Patient ID: Dawn Arellano, female    DOB: Oct 04, 1961  Age: 63 y.o. MRN: [id number]  CC:  Chief Complaint  Patient presents with   Hypertension    4 month follow up     HPI Dawn Arellano is a 63 y.o. female with past medical history of hypertension, presents for f/u of  chronic medical conditions.  The patient reports difficulty with sleep maintenance, averaging only 2-4 hours of sleep per night despite the use of baclofen , melatonin, diphenhydramine (Benadryl), and hydroxyzine .  She also reports a history of premature ventricular contractions (PVCs), previously stable and well controlled without medication. She is not currently under cardiology care but states that over the past few weeks, she has experienced an increase in frequency, with approximately three episodes daily. She denies associated symptoms such as chest pain, pressure, discomfort, shortness of breath, syncope, weakness, lightheadedness, or confusion during these episodes.  The patient requests a referral to cardiology to re-establish care and further evaluate her PVCs.   Past Medical History:  Diagnosis Date   Allergy Nsaids.   Asthma 1989   Encounter for annual general medical examination with abnormal findings in adult 07/18/2023   Hyperlipidemia with target LDL less than 100 09/24/2023   Hypertension 2018    Past Surgical History:  Procedure Laterality Date   BREAST SURGERY Right 2000   right intraductal papilloma    Family History  Problem Relation Age of Onset   Cancer Mother    Hypertension Mother    Cancer Father    Heart disease Father    Cancer Brother     Social History   Socioeconomic History   Marital status: Married    Spouse name: Not on file   Number of children: Not on file   Years of education: Not on file   Highest education level: Bachelor's degree (e.g., BA, AB, BS)  Occupational History   Not on file  Tobacco Use   Smoking  status: Never   Smokeless tobacco: Never  Substance and Sexual Activity   Alcohol use: Yes    Alcohol/week: 2.0 standard drinks of alcohol    Types: 2 Glasses of wine per week   Drug use: Never   Sexual activity: Yes    Birth control/protection: Post-menopausal  Other Topics Concern   Not on file  Social History Narrative   Not on file   Social Drivers of Health   Financial Resource Strain: Low Risk  (06/16/2023)   Overall Financial Resource Strain (CARDIA)    Difficulty of Paying Living Expenses: Not hard at all  Food Insecurity: No Food Insecurity (06/16/2023)   Hunger Vital Sign    Worried About Running Out of Food in the Last Year: Never true    Ran Out of Food in the Last Year: Never true  Transportation Needs: No Transportation Needs (06/16/2023)   PRAPARE - Administrator, Civil Service (Medical): No    Lack of Transportation (Non-Medical): No  Physical Activity: Sufficiently Active (06/16/2023)   Exercise Vital Sign    Days of Exercise per Week: 3 days    Minutes of Exercise per Session: 50 min  Stress: No Stress Concern Present (06/16/2023)   [employer] of Occupational Health - Occupational Stress Questionnaire    Feeling of Stress : Not at all  Social Connections: Socially Integrated (06/16/2023)   Social Connection and Isolation Panel    Frequency of Communication with Friends and Family: Twice a week  Frequency of Social Gatherings with Friends and Family: Twice a week    Attends Religious Services: 1 to 4 times per year    Active Member of [employer] or Organizations: Yes    Attends Banker Meetings: 1 to 4 times per year    Marital Status: Married  Catering manager Violence: Not on file    Outpatient Medications Prior to Visit  Medication Sig Dispense Refill   Albuterol -Budesonide  (AIRSUPRA ) 90-80 MCG/ACT AERO Inhale 2 puffs into the lungs every 6 (six) hours as needed (SOB). Do NOT use more than 6 dose (12 puffs) in 24 hrs. Rinse your  mouth with water after using to decrease the chance of getting a fungal infection (thrush) in your mouth and throat. 10.7 g 2   baclofen  (LIORESAL ) 10 MG tablet TAKE 1 TABLET BY MOUTH AT BEDTIME 60 tablet 0   hydrOXYzine  (VISTARIL ) 25 MG capsule TAKE 1 CAPSULE BY MOUTH AT BEDTIME AS NEEDED 90 capsule 0   lisinopril  (ZESTRIL ) 10 MG tablet Take 1 tablet (10 mg total) by mouth daily. 90 tablet 2   montelukast  (SINGULAIR ) 10 MG tablet Take 1 tablet (10 mg total) by mouth at bedtime. 30 tablet 0   montelukast  (SINGULAIR ) 10 MG tablet Take 1 tablet (10 mg total) by mouth at bedtime. 90 tablet 1   Multiple Vitamin (MULTIVITAMIN) tablet Take 1 tablet by mouth daily.     rosuvastatin  (CRESTOR ) 10 MG tablet Take 1 tablet (10 mg total) by mouth daily. 90 tablet 3   vitamin B-12 (CYANOCOBALAMIN) 1000 MCG tablet Take 1,000 mcg by mouth daily.     vitamin E 1000 UNIT capsule Take 1,000 Units by mouth daily.     Zinc Sulfate (ZINC-220 PO) Take by mouth.     No facility-administered medications prior to visit.    Allergies  Allergen Reactions   Nsaids Anaphylaxis    ROS Review of Systems  Constitutional:  Negative for chills and fever.  Eyes:  Negative for visual disturbance.  Respiratory:  Negative for chest tightness and shortness of breath.   Neurological:  Negative for dizziness and headaches.  Psychiatric/Behavioral:  Positive for sleep disturbance.       Objective:    Physical Exam HENT:     Head: Normocephalic.     Mouth/Throat:     Mouth: Mucous membranes are moist.  Cardiovascular:     Rate and Rhythm: Normal rate.     Heart sounds: Normal heart sounds.  Pulmonary:     Effort: Pulmonary effort is normal.     Breath sounds: Normal breath sounds.  Neurological:     Mental Status: She is alert.     BP 135/77   Pulse 96   Resp 16   Ht 5' 2 (1.575 m)   Wt 104 lb 1.3 oz (47.2 kg)   SpO2 96%   BMI 19.04 kg/m  Wt Readings from Last 3 Encounters:  07/21/24 104 lb 1.3 oz  (47.2 kg)  06/25/24 108 lb (49 kg)  02/04/24 108 lb 0.6 oz (49 kg)    Lab Results  Component Value Date   TSH 0.987 02/20/2024   Lab Results  Component Value Date   WBC 4.7 02/20/2024   HGB 13.4 02/20/2024   HCT 40.1 02/20/2024   MCV 92.6 02/20/2024   PLT 309 02/20/2024   Lab Results  Component Value Date   NA 138 02/20/2024   K 4.0 02/20/2024   CO2 25 02/20/2024   GLUCOSE 103 (H) 02/20/2024  BUN 13 02/20/2024   CREATININE 0.66 02/20/2024   BILITOT 0.6 02/20/2024   ALKPHOS 51 02/20/2024   AST 25 02/20/2024   ALT 21 02/20/2024   PROT 6.8 02/20/2024   ALBUMIN 4.1 02/20/2024   CALCIUM  9.3 02/20/2024   ANIONGAP 9 02/20/2024   EGFR 97 06/11/2023   Lab Results  Component Value Date   CHOL 215 (H) 02/20/2024   Lab Results  Component Value Date   HDL 72 02/20/2024   Lab Results  Component Value Date   LDLCALC 124 (H) 02/20/2024   Lab Results  Component Value Date   TRIG 94 02/20/2024   Lab Results  Component Value Date   CHOLHDL 3.0 02/20/2024   Lab Results  Component Value Date   HGBA1C 5.3 02/20/2024      Assessment & Plan:  Primary hypertension Assessment & Plan: BP controlled in the clinic. The patient reports being out of her BP medication for two weeks. She complains of a unilateral headache, notable behind her left eye. She denies that this is the worst headache of her life and reports no blurred vision, dizziness, or chest pain. No nausea or vomiting reported.  Refilled Lisinopril  10 mg today to resume therapy. Encouraged a low-sodium diet with increased physical activity. The patient previously took Fioricet for her headaches with relief, and therapy will be reinstated today. Encouraged to follow up if her symptoms worsen or fail to improve with the current regimen.     PVC (premature ventricular contraction) Assessment & Plan: Referral place to cardiology Encouraged to seek urgent care if experiencing any of these symptoms with her  PVCs: Chest pain, pressure, or discomfort -Shortness of breath or difficulty breathing -Fainting, near-fainting, or sudden dizziness -Rapid, sustained, or irregular heartbeat that does not resolve -Weakness, lightheadedness, or confusion -New or worsening swelling in your legs, ankles, or feet -Any symptoms that occur with exercise or suddenly at rest  Orders: -     Ambulatory referral to Cardiology  Sleep disturbance Assessment & Plan: Encourage the patient to start Trazodone  50 mg by mouth at bedtime.  Advise the patient to contact the clinic if minimal relief is experienced after 4 weeks of use so that possible dose adjustments can be discussed.  Sleep hygiene practices were reviewed, including maintaining a consistent sleep schedule, creating a comfortable sleep environment, limiting caffeine  and screen time before bed, and avoiding heavy meals close to bedtime.   Orders: -     traZODone  HCl; Take 1 tablet (50 mg total) by mouth at bedtime.  Dispense: 30 tablet; Refill: 3  IFG (impaired fasting glucose) -     Hemoglobin A1c  Vitamin D  deficiency -     VITAMIN D  25 Hydroxy (Vit-D Deficiency, Fractures)  TSH (thyroid -stimulating hormone deficiency) -     TSH + free T4  Other hyperlipidemia -     Lipid panel -     CMP14+EGFR -     CBC with Differential/Platelet  Note: This chart has been completed using Engineer, civil (consulting) software, and while attempts have been made to ensure accuracy, certain words and phrases may not be transcribed as intended.    Follow-up: Return in about 5 months (around 12/21/2024).   Lochlyn Zullo, FNP

## 2024-07-21 NOTE — Assessment & Plan Note (Signed)
 BP controlled in the clinic. The patient reports being out of her BP medication for two weeks. She complains of a unilateral headache, notable behind her left eye. She denies that this is the worst headache of her life and reports no blurred vision, dizziness, or chest pain. No nausea or vomiting reported.  Refilled Lisinopril 10 mg today to resume therapy. Encouraged a low-sodium diet with increased physical activity. The patient previously took Fioricet for her headaches with relief, and therapy will be reinstated today. Encouraged to follow up if her symptoms worsen or fail to improve with the current regimen.

## 2024-07-21 NOTE — Assessment & Plan Note (Signed)
 Encourage the patient to start Trazodone  50 mg by mouth at bedtime.  Advise the patient to contact the clinic if minimal relief is experienced after 4 weeks of use so that possible dose adjustments can be discussed.  Sleep hygiene practices were reviewed, including maintaining a consistent sleep schedule, creating a comfortable sleep environment, limiting caffeine  and screen time before bed, and avoiding heavy meals close to bedtime.

## 2024-07-23 ENCOUNTER — Ambulatory Visit (INDEPENDENT_AMBULATORY_CARE_PROVIDER_SITE_OTHER): Admitting: Podiatry

## 2024-07-23 DIAGNOSIS — M2041 Other hammer toe(s) (acquired), right foot: Secondary | ICD-10-CM | POA: Diagnosis not present

## 2024-07-23 DIAGNOSIS — M21611 Bunion of right foot: Secondary | ICD-10-CM | POA: Diagnosis not present

## 2024-07-23 DIAGNOSIS — M21619 Bunion of unspecified foot: Secondary | ICD-10-CM

## 2024-07-24 NOTE — Progress Notes (Signed)
 Subjective:   Patient ID: Dawn Arellano, female   DOB: 63 y.o.   MRN: [id number]   HPI Patient presents with husband with chronic foot structure issues right that she wants to have addressed and have surgery.  States that her bunion really bothers her her 2nd and 3rd and 4th toes bother her and she has had this for a long time and she is now ready to get it corrected.  She does if possible want a procedure that she can stay weightbearing   ROS      Objective:  Physical Exam  Neurovascular status was found to be intact with patient found to have good digital perfusion well oriented and has large hyperostosis medial aspect first metatarsal head right has elevated second third toes and distal contracture digit 4 right with pain.  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  Recommended distal osteotomy versus proximal fusion while we may not be able to get complete correction she is mobile and I think it we will put it into a satisfactory position and may also require an Aiken osteotomy.  I have recommended digital fusion digits 2 3 and distal arthroplasty digit 4 right and allowed her to go over the consent form reviewing the procedures and complications that can occur.  Patient is scheduled for outpatient surgery and understands total recovery takes around 6 months and she will be in some form of immobilization for about 6 weeks.  Today air fracture walker was dispensed properly fitted to her lower leg that I want her to wear prior to surgery and get used to and she also will look for a lift for her left foot

## 2024-07-25 ENCOUNTER — Telehealth: Payer: Self-pay | Admitting: Podiatry

## 2024-07-25 ENCOUNTER — Other Ambulatory Visit (HOSPITAL_COMMUNITY)
Admission: RE | Admit: 2024-07-25 | Discharge: 2024-07-25 | Disposition: A | Discharge status: Home / Self Care | Source: Ambulatory Visit | Attending: Family Medicine | Admitting: Family Medicine

## 2024-07-25 DIAGNOSIS — R7301 Impaired fasting glucose: Secondary | ICD-10-CM | POA: Diagnosis not present

## 2024-07-25 LAB — TSH: TSH: 0.8 u[IU]/mL (ref 0.350–4.500)

## 2024-07-25 LAB — CBC WITH DIFFERENTIAL/PLATELET
Abs Immature Granulocytes: 0.01 K/uL (ref 0.00–0.07)
Basophils Absolute: 0.1 K/uL (ref 0.0–0.1)
Basophils Relative: 1 %
Eosinophils Absolute: 0.1 K/uL (ref 0.0–0.5)
Eosinophils Relative: 3 %
HCT: 38.2 % (ref 36.0–46.0)
Hemoglobin: 12.8 g/dL (ref 12.0–15.0)
Immature Granulocytes: 0 %
Lymphocytes Relative: 35 %
Lymphs Abs: 1.5 K/uL (ref 0.7–4.0)
MCH: 31.2 pg (ref 26.0–34.0)
MCHC: 33.5 g/dL (ref 30.0–36.0)
MCV: 93.2 fL (ref 80.0–100.0)
Monocytes Absolute: 0.3 K/uL (ref 0.1–1.0)
Monocytes Relative: 8 %
Neutro Abs: 2.3 K/uL (ref 1.7–7.7)
Neutrophils Relative %: 53 %
Platelets: 277 K/uL (ref 150–400)
RBC: 4.1 MIL/uL (ref 3.87–5.11)
RDW: 13.4 % (ref 11.5–15.5)
WBC: 4.4 K/uL (ref 4.0–10.5)
nRBC: 0 % (ref 0.0–0.2)

## 2024-07-25 LAB — LIPID PANEL
Cholesterol: 208 mg/dL — ABNORMAL HIGH (ref 0–200)
HDL: 66 mg/dL (ref >40)
LDL Cholesterol: 121 mg/dL — ABNORMAL HIGH (ref 0–99)
Total CHOL/HDL Ratio: 3.2 ratio
Triglycerides: 105 mg/dL (ref <150)
VLDL: 21 mg/dL (ref 0–40)

## 2024-07-25 LAB — COMPREHENSIVE METABOLIC PANEL WITH GFR
ALT: 17 U/L (ref 0–44)
AST: 21 U/L (ref 15–41)
Albumin: 3.8 g/dL (ref 3.5–5.0)
Alkaline Phosphatase: 46 U/L (ref 38–126)
Anion gap: 10 (ref 5–15)
BUN: 16 mg/dL (ref 8–23)
CO2: 23 mmol/L (ref 22–32)
Calcium: 8.8 mg/dL — ABNORMAL LOW (ref 8.9–10.3)
Chloride: 106 mmol/L (ref 98–111)
Creatinine, Ser: 0.54 mg/dL (ref 0.44–1.00)
GFR, Estimated: >60 mL/min (ref >60)
Glucose, Bld: 96 mg/dL (ref 70–99)
Potassium: 4 mmol/L (ref 3.5–5.1)
Sodium: 139 mmol/L (ref 135–145)
Total Bilirubin: 0.6 mg/dL (ref 0.0–1.2)
Total Protein: 6.6 g/dL (ref 6.5–8.1)

## 2024-07-25 LAB — VITAMIN D 25 HYDROXY (VIT D DEFICIENCY, FRACTURES): Vit D, 25-Hydroxy: 55.46 ng/mL (ref 30–100)

## 2024-07-25 LAB — T4, FREE: Free T4: 1.14 ng/dL — ABNORMAL HIGH (ref 0.61–1.12)

## 2024-07-25 NOTE — Telephone Encounter (Signed)
 Received surgical consent form   Left message for pt to call to schedule the surgery.

## 2024-07-27 ENCOUNTER — Ambulatory Visit: Payer: Self-pay | Admitting: Family Medicine

## 2024-07-27 DIAGNOSIS — E038 Other specified hypothyroidism: Secondary | ICD-10-CM

## 2024-07-27 LAB — HEMOGLOBIN A1C
Hgb A1c MFr Bld: 5.5 % (ref 4.8–5.6)
Mean Plasma Glucose: 111 mg/dL

## 2024-07-28 ENCOUNTER — Encounter: Payer: Self-pay | Admitting: Family Medicine

## 2024-07-30 NOTE — Telephone Encounter (Signed)
 LVM for pt to call back and schedule. Nothing available until Dec.

## 2024-08-12 ENCOUNTER — Encounter

## 2024-08-12 ENCOUNTER — Other Ambulatory Visit (HOSPITAL_COMMUNITY): Payer: Self-pay

## 2024-08-15 ENCOUNTER — Telehealth: Payer: Self-pay | Admitting: Podiatry

## 2024-08-15 NOTE — Telephone Encounter (Signed)
 DOS- 09/09/2024  HAMMERTOE REPAIR 2ND, 3RD, + 4TH RT- 28285 [place] BUNIONECTOMY RT- 28296 [place] OSTEOTOMY RT- 28310  [employer]ET[employer] EFFECTIVE DATE- 12/11/2022  DEDUCTIBLE- $600 REMAINING- $300 OOP- $9200 REMAINING- $1480.85 COINSURANCE- 30%  PER [employer] WEBSITE, NO PRIOR AUTH IS REQUIRED FOR CPT CODES 71714 (3 UNITS), 71703, AND 71689. DOCUMENTATION ATTACHED TO SURGERY CONSENT PACKET.

## 2024-08-19 ENCOUNTER — Other Ambulatory Visit (HOSPITAL_COMMUNITY): Payer: Self-pay

## 2024-08-19 ENCOUNTER — Other Ambulatory Visit: Payer: Self-pay

## 2024-08-20 ENCOUNTER — Other Ambulatory Visit: Payer: Self-pay | Admitting: Family Medicine

## 2024-08-20 DIAGNOSIS — M62838 Other muscle spasm: Secondary | ICD-10-CM

## 2024-08-29 NOTE — Telephone Encounter (Signed)
 pt lft mess on my vmail needing fax# for her leave forms. I cld bk and lft mess on her vmail and advised of 25.00 fee must be paid prior to forms being completed and I lft my fax#

## 2024-08-30 ENCOUNTER — Other Ambulatory Visit: Payer: Self-pay | Admitting: Family Medicine

## 2024-08-30 DIAGNOSIS — J4520 Mild intermittent asthma, uncomplicated: Secondary | ICD-10-CM

## 2024-08-31 ENCOUNTER — Telehealth

## 2024-08-31 ENCOUNTER — Telehealth: Admitting: Nurse Practitioner

## 2024-08-31 DIAGNOSIS — J4520 Mild intermittent asthma, uncomplicated: Secondary | ICD-10-CM

## 2024-08-31 MED ORDER — ALBUTEROL SULFATE HFA 108 (90 BASE) MCG/ACT IN AERS: 2.0000 | INHALATION_SPRAY | Freq: Four times a day (QID) | RESPIRATORY_TRACT | 0 refills | Status: DC | PRN

## 2024-08-31 NOTE — Progress Notes (Signed)
 Video connection was lost when less than 50% of the duration of the visit was complete, at which time the remainder of the visit was completed via audio only.      Virtual Visit Consent   Dawn Arellano, you are scheduled for a virtual visit with a [facility] provider today. Just as with appointments in the office, your consent must be obtained to participate. Your consent will be active for this visit and any virtual visit you may have with one of our providers in the next 365 days. If you have a MyChart account, a copy of this consent can be sent to you electronically.  As this is a virtual visit, video technology does not allow for your provider to perform a traditional examination. This may limit your provider's ability to fully assess your condition. If your provider identifies any concerns that need to be evaluated in person or the need to arrange testing (such as labs, EKG, etc.), we will make arrangements to do so. Although advances in technology are sophisticated, we cannot ensure that it will always work on either your end or our end. If the connection with a video visit is poor, the visit may have to be switched to a telephone visit. With either a video or telephone visit, we are not always able to ensure that we have a secure connection.  By engaging in this virtual visit, you consent to the provision of healthcare and authorize for your insurance to be billed (if applicable) for the services provided during this visit. Depending on your insurance coverage, you may receive a charge related to this service.  I need to obtain your verbal consent now. Are you willing to proceed with your visit today? Dawn Arellano has provided verbal consent on 08/31/2024 for a virtual visit (video or telephone). Haze LELON Servant, NP  Date: 08/31/2024 3:34 PM   Virtual Visit via Video Note   I, Haze LELON Servant, connected with  Dawn Arellano  ([id number], December 04, 1961) on 08/31/24 at  4:00 PM EDT by a  video-enabled telemedicine application and verified that I am speaking with the correct person using two identifiers.  Location: Patient: Virtual Visit Location Patient: Home Provider: Virtual Visit Location Provider: Home Office   I discussed the limitations of evaluation and management by telemedicine and the availability of in person appointments. The patient expressed understanding and agreed to proceed.    History of Present Illness: Dawn Arellano is a 63 y.o. who identifies as a female who was assigned female at birth, and is being seen today for asthma.  [name] is unable to locate her airsupra  inhaler and is in need of a replacement inhaler at this time. She is not currently experiencing an asthma exacerbation but needs a rescue inhaler for as needed symptoms. She has been unable to get in touch with her PCP for refill.    Problems:  Patient Active Problem List   Diagnosis Date Noted   PVC (premature ventricular contraction) 07/21/2024   Migraine without aura 02/06/2024   Hyperlipidemia with target LDL less than 100 09/24/2023   Encounter for annual general medical examination with abnormal findings in adult 07/18/2023   Cervical cancer screening 06/20/2023   Hypertension 05/23/2023   Hammer toe of right foot 05/23/2023   Sleep disturbance 05/23/2023   Mild intermittent asthma 05/23/2023   Cellulitis of arm 07/05/2022    Allergies:  Allergies  Allergen Reactions   Nsaids Anaphylaxis   Medications:  Current Outpatient Medications:  albuterol  (VENTOLIN  HFA) 108 (90 Base) MCG/ACT inhaler, Inhale 2 puffs into the lungs every 6 (six) hours as needed for wheezing or shortness of breath., Disp: 8 g, Rfl: 0   Albuterol -Budesonide  (AIRSUPRA ) 90-80 MCG/ACT AERO, Inhale 2 puffs into the lungs every 6 (six) hours as needed (SOB). Do NOT use more than 6 dose (12 puffs) in 24 hrs. Rinse your mouth with water after using to decrease the chance of getting a fungal infection (thrush)  in your mouth and throat., Disp: 10.7 g, Rfl: 2   baclofen  (LIORESAL ) 10 MG tablet, TAKE 1 TABLET BY MOUTH AT BEDTIME, Disp: 60 tablet, Rfl: 0   hydrOXYzine  (VISTARIL ) 25 MG capsule, TAKE 1 CAPSULE BY MOUTH AT BEDTIME AS NEEDED, Disp: 90 capsule, Rfl: 0   lisinopril  (ZESTRIL ) 10 MG tablet, Take 1 tablet (10 mg total) by mouth daily., Disp: 90 tablet, Rfl: 2   montelukast  (SINGULAIR ) 10 MG tablet, Take 1 tablet (10 mg total) by mouth at bedtime., Disp: 30 tablet, Rfl: 0   montelukast  (SINGULAIR ) 10 MG tablet, Take 1 tablet (10 mg total) by mouth at bedtime., Disp: 90 tablet, Rfl: 1   Multiple Vitamin (MULTIVITAMIN) tablet, Take 1 tablet by mouth daily., Disp: , Rfl:    rosuvastatin  (CRESTOR ) 10 MG tablet, Take 1 tablet (10 mg total) by mouth daily., Disp: 90 tablet, Rfl: 3   traZODone  (DESYREL ) 50 MG tablet, Take 1 tablet (50 mg total) by mouth at bedtime., Disp: 30 tablet, Rfl: 3   vitamin B-12 (CYANOCOBALAMIN) 1000 MCG tablet, Take 1,000 mcg by mouth daily., Disp: , Rfl:    vitamin E 1000 UNIT capsule, Take 1,000 Units by mouth daily., Disp: , Rfl:    Zinc Sulfate (ZINC-220 PO), Take by mouth., Disp: , Rfl:   Observations/Objective: Patient is well-developed, well-nourished in no acute distress.  Resting comfortably at home.  Head is normocephalic, atraumatic.  No labored breathing.  Speech is clear and coherent with logical content.  Patient is alert and oriented at baseline.    Assessment and Plan: 1. Mild intermittent asthma without complication (Primary) - albuterol  (VENTOLIN  HFA) 108 (90 Base) MCG/ACT inhaler; Inhale 2 puffs into the lungs every 6 (six) hours as needed for wheezing or shortness of breath.  Dispense: 8 g; Refill: 0    Follow Up Instructions: I discussed the assessment and treatment plan with the patient. The patient was provided an opportunity to ask questions and all were answered. The patient agreed with the plan and demonstrated an understanding of the  instructions.  A copy of instructions were sent to the patient via MyChart unless otherwise noted below.     The patient was advised to call back or seek an in-person evaluation if the symptoms worsen or if the condition fails to improve as anticipated.    Raeven Pint W Xayvion Shirah, NP

## 2024-08-31 NOTE — Patient Instructions (Signed)
 Bernarda Rivet, thank you for joining Haze LELON Servant, NP for today's virtual visit.  While this provider is not your primary care provider (PCP), if your PCP is located in our provider database this encounter information will be shared with them immediately following your visit.   A [facility] MyChart account gives you access to today's visit and all your visits, tests, and labs performed at [facility]  click here if you don't have a [facility] MyChart account or go to [email or web address]  Consent: (Patient) Dawn Arellano provided verbal consent for this virtual visit at the beginning of the encounter.  Current Medications:  Current Outpatient Medications:    albuterol  (VENTOLIN  HFA) 108 (90 Base) MCG/ACT inhaler, Inhale 2 puffs into the lungs every 6 (six) hours as needed for wheezing or shortness of breath., Disp: 8 g, Rfl: 0   Albuterol -Budesonide  (AIRSUPRA ) 90-80 MCG/ACT AERO, Inhale 2 puffs into the lungs every 6 (six) hours as needed (SOB). Do NOT use more than 6 dose (12 puffs) in 24 hrs. Rinse your mouth with water after using to decrease the chance of getting a fungal infection (thrush) in your mouth and throat., Disp: 10.7 g, Rfl: 2   baclofen  (LIORESAL ) 10 MG tablet, TAKE 1 TABLET BY MOUTH AT BEDTIME, Disp: 60 tablet, Rfl: 0   hydrOXYzine  (VISTARIL ) 25 MG capsule, TAKE 1 CAPSULE BY MOUTH AT BEDTIME AS NEEDED, Disp: 90 capsule, Rfl: 0   lisinopril  (ZESTRIL ) 10 MG tablet, Take 1 tablet (10 mg total) by mouth daily., Disp: 90 tablet, Rfl: 2   montelukast  (SINGULAIR ) 10 MG tablet, Take 1 tablet (10 mg total) by mouth at bedtime., Disp: 30 tablet, Rfl: 0   montelukast  (SINGULAIR ) 10 MG tablet, Take 1 tablet (10 mg total) by mouth at bedtime., Disp: 90 tablet, Rfl: 1   Multiple Vitamin (MULTIVITAMIN) tablet, Take 1 tablet by mouth daily., Disp: , Rfl:    rosuvastatin  (CRESTOR ) 10 MG tablet, Take 1 tablet (10 mg total) by mouth daily., Disp: 90 tablet, Rfl: 3    traZODone  (DESYREL ) 50 MG tablet, Take 1 tablet (50 mg total) by mouth at bedtime., Disp: 30 tablet, Rfl: 3   vitamin B-12 (CYANOCOBALAMIN) 1000 MCG tablet, Take 1,000 mcg by mouth daily., Disp: , Rfl:    vitamin E 1000 UNIT capsule, Take 1,000 Units by mouth daily., Disp: , Rfl:    Zinc Sulfate (ZINC-220 PO), Take by mouth., Disp: , Rfl:    Medications ordered in this encounter:  Meds ordered this encounter  Medications   albuterol  (VENTOLIN  HFA) 108 (90 Base) MCG/ACT inhaler    Sig: Inhale 2 puffs into the lungs every 6 (six) hours as needed for wheezing or shortness of breath.    Dispense:  8 g    Refill:  0    RECENT AIRSUPRA  INHALER HAS BEEN LOST.    Supervising Provider:   BLAISE ALEENE KIDD [[id number]]     *If you need refills on other medications prior to your next appointment, please contact your pharmacy*  Follow-Up: Call back or seek an in-person evaluation if the symptoms worsen or if the condition fails to improve as anticipated.  [facility] [phone]    If you have been instructed to have an in-person evaluation today at a local Urgent Care facility, please use the link below. It will take you to a list of all of our available [facility], including address, phone number and hours of operation. Please do not delay care.  [facility]  If you or a family member do not have a primary care provider, use the link below to schedule a visit and establish care. When you choose a [facility] primary care physician or advanced practice provider, you gain a long-term partner in health. Find a Primary Care Provider  Learn more about [facility]'s in-office and virtual care options: [facility] - Get Care Now

## 2024-09-06 ENCOUNTER — Other Ambulatory Visit: Payer: Self-pay | Admitting: Family Medicine

## 2024-09-06 DIAGNOSIS — J4520 Mild intermittent asthma, uncomplicated: Secondary | ICD-10-CM

## 2024-09-08 MED ORDER — ONDANSETRON HCL 4 MG PO TABS: 4.0000 mg | ORAL_TABLET | Freq: Three times a day (TID) | ORAL | 0 refills | Status: DC | PRN

## 2024-09-08 MED ORDER — OXYCODONE-ACETAMINOPHEN 10-325 MG PO TABS: 1.0000 | ORAL_TABLET | ORAL | 0 refills | Status: DC | PRN

## 2024-09-08 NOTE — Addendum Note (Signed)
 Addended by: MAGDALEN PASCO RAMAN on: 09/08/2024 05:25 PM   Modules accepted: Orders

## 2024-09-09 DIAGNOSIS — M21611 Bunion of right foot: Secondary | ICD-10-CM | POA: Diagnosis not present

## 2024-09-09 DIAGNOSIS — M2011 Hallux valgus (acquired), right foot: Secondary | ICD-10-CM | POA: Diagnosis not present

## 2024-09-09 DIAGNOSIS — M205X1 Other deformities of toe(s) (acquired), right foot: Secondary | ICD-10-CM | POA: Diagnosis not present

## 2024-09-09 DIAGNOSIS — M2041 Other hammer toe(s) (acquired), right foot: Secondary | ICD-10-CM | POA: Diagnosis not present

## 2024-09-11 ENCOUNTER — Telehealth: Payer: Self-pay | Admitting: Lab

## 2024-09-11 ENCOUNTER — Other Ambulatory Visit: Payer: Self-pay | Admitting: Podiatry

## 2024-09-11 MED ORDER — OXYCODONE-ACETAMINOPHEN 10-325 MG PO TABS: 1.0000 | ORAL_TABLET | ORAL | 0 refills | Status: DC | PRN

## 2024-09-11 NOTE — Telephone Encounter (Signed)
 Patient calling states had surgery has has a lot of break through pain and needing refill worried about running out on the weekend please advise.

## 2024-09-11 NOTE — Telephone Encounter (Signed)
 done

## 2024-09-15 ENCOUNTER — Ambulatory Visit: Admitting: Podiatry

## 2024-09-15 ENCOUNTER — Ambulatory Visit (INDEPENDENT_AMBULATORY_CARE_PROVIDER_SITE_OTHER)

## 2024-09-15 DIAGNOSIS — M2041 Other hammer toe(s) (acquired), right foot: Secondary | ICD-10-CM | POA: Diagnosis not present

## 2024-09-15 DIAGNOSIS — Z9889 Other specified postprocedural states: Secondary | ICD-10-CM

## 2024-09-15 MED ORDER — ONDANSETRON HCL 4 MG PO TABS: 4.0000 mg | ORAL_TABLET | Freq: Three times a day (TID) | ORAL | 0 refills | Status: DC | PRN

## 2024-09-15 MED ORDER — OXYCODONE-ACETAMINOPHEN 10-325 MG PO TABS: 1.0000 | ORAL_TABLET | ORAL | 0 refills | Status: DC | PRN

## 2024-09-15 NOTE — Patient Instructions (Signed)

## 2024-09-15 NOTE — Progress Notes (Unsigned)
 Dawn Arellano  Date of Surgery: 09/09/2024  Surgery Performed: RT AUSTIN BUNIONECTOMY W/ FIXATION, RT 2ND/3RD DIGIT FUSION W DISTAL ARTHROPLASTY 4TH DIGIT, POSSIBLE RT AIKEN OSTEOTOMY  Surgeon: Dr. Magdalen    Subjective: -Patient Complaints: kbpostopptcomplaints: throbbing, nausea, and vomiting. Did have elevated temp of 100.1 this morning but denies having elevated temp/fever any other times.  -Pain Level: 6/10 -Pain Description: intermittent and stabbing -DVT Questions: Denies symptoms -Medication Compliance: [x] antibiotics  [x] pain medication   []  -Wound Care adherence: [x] yes     [] no -Weight Bearing adherence:  [x] yes     [] no -New Symptoms: complains of low grade fever, nausea, vomiting intermittently over the past three days. Also, requests refill on Zofran and refill on Percocet.    Objective: -Vital Signs:  Vitals:   09/15/24 1435  BP: 113/64  Pulse: 92  Resp: 16  Temp: 99 F (37.2 C)  SpO2: 96%    -Radiographs: [x] Obtained    [] Not Obtained -Inspection of surgical site:   -Dressing: kbsurgdressing: dry, intact, soiled, and left in place since surgery  -Cast/Splint/Pins: [] Removed    [x] Left in place    [] Replaced  -Signs of infection: redness, drainage, warmth.   -Drainage: scant, serosanguinous, purulent  -Surgical Incision/Wound Closure: Sutures left in place.    -Skin Appearance: kbpostopskin: bruising and tender to touch incisional erythema. Bruising present on plantar aspect of heel, toes and shin.   -Ambulatory Status: ambulatory, Partial Weight-Bearing, and Using DME/Assistive Device crutches and cam boot -DVT Assessment: Negative  - -Neurovascular Status:  -Pulses: present   -Capillary Refill: [x] <3 seconds     [] >3 seconds  -Sensation: [x] Intact     [] Numbness/Tingling     [] Decreased   Assessment: -Radiographs reviewed by provider if applicable.  -Signs of infection: [x] Present     [] Absent -Wound Healing: [x] Within expected parameters     [] Delayed  healing -Pain management: [x] Adequate     [] Needs adjustment -DVT Findings: [x] Negative findings     [] Positive Findings  -Patient did have minimal bleeding present with dressing removal.   Plan: -Wound care instructions reinforced: [x] Yes -Dressing Changed: [x] Yes     [] No  -Dressing used: xeroform, 4x4 gauze, kerlix, ace wrap, stockinet, cam boot.  -Sutures/Staples care: none needed -DVT Plan:[x] Provider informed    [] Protocol activated -Patient education provided on:  -Continue DVT prophylaxis as previously discussed (if appropriate)  -Signs of infection  -Activity restrictions  -Weight bearing status  -Dressing change   -Wound Care   -Follow up appointment: 2 weeks   -Dr. GEANNIE Magdalen evaluated patient and instructed patient to stay in boot for 2 weeks. Can weight bear, but must be in boot or shoe.  Will need additional radiographs.

## 2024-09-17 NOTE — Progress Notes (Signed)
 Subjective:   Patient ID: Dawn Arellano, female   DOB: 63 y.o.   MRN: [id number]   HPI Patient seen by nurse for recovery of forefoot reconstruction.  States that she is doing well overall   ROS      Objective:  Physical Exam  Neurovascular status was found to be intact negative Toula' sign was noted good structural alignment pins intact     Assessment:  Difficult procedures but appears to be doing well     Plan:  Reviewed condition reviewed x-ray indicating there could be some stress on the first osteotomy issues that some soft bone and I advised the continuation of boot usage for the next 2 weeks to reevaluate.  Sterile dressing reapplied bringing the digits down and patient will be seen back to reevaluate with all questions answered encouraged to call with questions

## 2024-09-18 ENCOUNTER — Telehealth: Payer: Self-pay | Admitting: Lab

## 2024-09-18 ENCOUNTER — Other Ambulatory Visit: Payer: Self-pay | Admitting: Lab

## 2024-09-18 NOTE — Telephone Encounter (Signed)
 Patient states she is in need of refill on pain medication stated didn't need it her last visit but now she does.

## 2024-09-20 ENCOUNTER — Other Ambulatory Visit: Payer: Self-pay | Admitting: Family Medicine

## 2024-09-20 DIAGNOSIS — G479 Sleep disorder, unspecified: Secondary | ICD-10-CM

## 2024-09-26 ENCOUNTER — Telehealth: Payer: Self-pay | Admitting: Podiatry

## 2024-09-26 NOTE — Telephone Encounter (Signed)
 pt lft mess about forms- fee was paid 09/02/24. I called to adv I had not rec any forms for her. She will call [employer] and give my fax# again. confirmed she had the correct fax for me

## 2024-09-29 ENCOUNTER — Ambulatory Visit (INDEPENDENT_AMBULATORY_CARE_PROVIDER_SITE_OTHER)

## 2024-09-29 ENCOUNTER — Ambulatory Visit (INDEPENDENT_AMBULATORY_CARE_PROVIDER_SITE_OTHER): Admitting: Podiatry

## 2024-09-29 VITALS — BP 114/67 | HR 67 | Temp 97.5°F

## 2024-09-29 DIAGNOSIS — M21619 Bunion of unspecified foot: Secondary | ICD-10-CM

## 2024-09-29 DIAGNOSIS — M21611 Bunion of right foot: Secondary | ICD-10-CM

## 2024-09-29 DIAGNOSIS — Z9889 Other specified postprocedural states: Secondary | ICD-10-CM

## 2024-09-29 MED ORDER — ONDANSETRON HCL 4 MG PO TABS: 4.0000 mg | ORAL_TABLET | Freq: Three times a day (TID) | ORAL | 0 refills | Status: DC | PRN

## 2024-09-29 MED ORDER — HYDROCODONE-ACETAMINOPHEN 10-325 MG PO TABS: 1.0000 | ORAL_TABLET | Freq: Three times a day (TID) | ORAL | 0 refills | Status: AC | PRN

## 2024-09-29 NOTE — Progress Notes (Unsigned)
 Patient presents for post-op visit today, POV # 2 DOS 09/09/24 RT AUSTIN BUNIONECTOMY W/ FIXATION, RT 2ND/3RD DIGIT FUSION W DISTAL ARTHROPLASTY 4TH DIGIT, POSSIBLE RT AIKEN OSTEOTOMY  Still hurting. Hurts more when I get up and move around. Using Tylenol  arthritis, but that is not helping much. Had a bad GI virus after visit last time..  RN Notes: n/a  Vital Signs: Today's Vitals   09/29/24 1356  BP: 114/67  Pulse: 67  Temp: (!) 97.5 F (36.4 C)  TempSrc: Oral  PainSc: 6   PainLoc: Foot      Radiographs: [x]  Taken []  Not taken  Surgical Site Assessment:  - Dressing:  [x]  Minimal dry blood, intact []  Reinforced   []  Changed     -RN Notes: n/a  - Incision:  [x]  CDI (clean, dry, intact)  []  Mild erythema  []  Drainage noted   -RN Notes: n/a  - Swelling:  []  None  [x]  Mild  []  Moderate   []  Significant     -RN Notes: n/a  - Bruising:  []  None  [x]  Present: diffuse bruising around foot.    - Sutures/Staples:  []  None [x]  Intact  [x]  Removed Today  []  Plan to remove at next visit   -Cast/Splint/Pins: []  None [x]  Intact []  Removed Today []  Plan to remove at next visit []  Replaced  -Signs of infection:  [x]  None  []  Present - Describe: n/a  -DME:    []  None [x]  AFW []  Surgical shoe []  Cast  []  Splint  -Walking status:  [x]  Full WB  []  Partial WB  []  NWB  -Utilizing device:  [x]  None []  Knee Scooter []  Crutches []  Wheelchair    DVT assessment:  [x]  Denies symptoms []  Chest pain/SOB []  Pain in calf/redness/warmth   Redressed DSD and ace wrap. Educated on signs of infection, proper dressing care, pain management, and weight bearing status. Patient will contact provider with any new or worsening symptoms. The provider assessed the patient today and reviewed instructions regarding plan of care.

## 2024-09-30 ENCOUNTER — Telehealth: Payer: Self-pay | Admitting: Podiatry

## 2024-09-30 NOTE — Telephone Encounter (Signed)
 Faxed [employer] forms and notes to [phone] [place] approx 12/01/24

## 2024-10-01 NOTE — Progress Notes (Signed)
 Subjective:   Patient ID: Dawn Arellano, female   DOB: 63 y.o.   MRN: [id number]   HPI Patient seen by nurse today for stitch removal states overall doing well still having pain   ROS      Objective:  Physical Exam  Vascular status seen by me was good as negative Toula' sign noted.  Incision sites healing well good alignment fixation in place     Assessment:  Doing well     Plan:  Nurse did full evaluation and at this point I reviewed x-rays indicating good alignment no further movement of the osteotomy and she will continue with immobilization for 2 more weeks be seen back stitches removed today

## 2024-10-06 ENCOUNTER — Other Ambulatory Visit: Payer: Self-pay | Admitting: Family Medicine

## 2024-10-06 DIAGNOSIS — M62838 Other muscle spasm: Secondary | ICD-10-CM

## 2024-10-13 ENCOUNTER — Other Ambulatory Visit (HOSPITAL_COMMUNITY): Payer: Self-pay | Admitting: Family Medicine

## 2024-10-13 ENCOUNTER — Ambulatory Visit (INDEPENDENT_AMBULATORY_CARE_PROVIDER_SITE_OTHER): Admitting: Podiatry

## 2024-10-13 ENCOUNTER — Ambulatory Visit

## 2024-10-13 ENCOUNTER — Encounter: Payer: Self-pay | Admitting: Podiatry

## 2024-10-13 DIAGNOSIS — M21611 Bunion of right foot: Secondary | ICD-10-CM | POA: Diagnosis not present

## 2024-10-13 DIAGNOSIS — Z1231 Encounter for screening mammogram for malignant neoplasm of breast: Secondary | ICD-10-CM

## 2024-10-13 DIAGNOSIS — M21619 Bunion of unspecified foot: Secondary | ICD-10-CM

## 2024-10-13 MED ORDER — HYDROCODONE-ACETAMINOPHEN 5-325 MG PO TABS: 1.0000 | ORAL_TABLET | Freq: Four times a day (QID) | ORAL | 0 refills | Status: DC | PRN

## 2024-10-13 MED ORDER — ONDANSETRON HCL 4 MG PO TABS: 4.0000 mg | ORAL_TABLET | Freq: Three times a day (TID) | ORAL | 0 refills | Status: DC | PRN

## 2024-10-13 NOTE — Progress Notes (Signed)
 Subjective:   Patient ID: Dawn Arellano, female   DOB: 62 y.o.   MRN: [id number]   HPI Patient states she is ready to get the pins out of her toes states that the second toe especially has been bothersome   ROS      Objective:  Physical Exam  Neurovascular status intact negative Toula' sign noted wound edges coapted well with the patient found to have good alignment of the toes does not have good motion first MPJ admits she has not been working it is much as she should with pain second toe but no obvious reason     Assessment:  Structurally looks very good good alignment noted good positional component with mild reduction motion first MPJ     Plan:  H&P x-rays taken reviewed and I went ahead today and I have recommended gradual return to activities and the importance of bending the first MPJ.  I did remove the pins 2nd and 3rd toes tolerated well and applied sterile dressings  X-rays indicate alignment looks good joint is congruence with no indication to currently of further movement of the first metatarsal.  Reappoint 3 weeks or earlier if needed

## 2024-10-17 ENCOUNTER — Encounter: Payer: Self-pay | Admitting: Podiatry

## 2024-10-20 ENCOUNTER — Other Ambulatory Visit (HOSPITAL_COMMUNITY): Payer: Self-pay

## 2024-10-20 ENCOUNTER — Encounter: Admitting: Family Medicine

## 2024-10-20 ENCOUNTER — Ambulatory Visit (HOSPITAL_COMMUNITY)
Admission: RE | Admit: 2024-10-20 | Discharge: 2024-10-20 | Disposition: A | Discharge status: Home / Self Care | Source: Ambulatory Visit | Attending: Family Medicine | Admitting: Family Medicine

## 2024-10-20 ENCOUNTER — Encounter (HOSPITAL_COMMUNITY): Payer: Self-pay

## 2024-10-20 DIAGNOSIS — Z1231 Encounter for screening mammogram for malignant neoplasm of breast: Secondary | ICD-10-CM | POA: Diagnosis not present

## 2024-10-21 ENCOUNTER — Other Ambulatory Visit (HOSPITAL_COMMUNITY): Payer: Self-pay

## 2024-10-24 ENCOUNTER — Telehealth: Payer: Self-pay | Admitting: Lab

## 2024-10-24 ENCOUNTER — Telehealth: Payer: Self-pay | Admitting: Podiatry

## 2024-10-24 NOTE — Telephone Encounter (Signed)
 Patient called in requesting that her pain medication be changed back to 10 mg. She says that she is experiencing pain in the middle of the night.

## 2024-10-24 NOTE — Telephone Encounter (Signed)
 Patient is requesting pain medication and a change in the dose from 5 to 10mg  HYDROcodone -acetaminophen  (NORCO/VICODIN) 5-325 MG tablet please review and advise.

## 2024-10-29 ENCOUNTER — Other Ambulatory Visit: Payer: Self-pay | Admitting: Podiatry

## 2024-10-29 MED ORDER — HYDROCODONE-ACETAMINOPHEN 10-325 MG PO TABS: 1.0000 | ORAL_TABLET | Freq: Four times a day (QID) | ORAL | 0 refills | Status: AC | PRN

## 2024-10-29 NOTE — Telephone Encounter (Signed)
 done

## 2024-11-03 ENCOUNTER — Ambulatory Visit: Admitting: Podiatry

## 2024-11-03 ENCOUNTER — Other Ambulatory Visit: Payer: Self-pay | Admitting: Family Medicine

## 2024-11-03 ENCOUNTER — Ambulatory Visit

## 2024-11-03 DIAGNOSIS — M2041 Other hammer toe(s) (acquired), right foot: Secondary | ICD-10-CM | POA: Diagnosis not present

## 2024-11-03 DIAGNOSIS — G479 Sleep disorder, unspecified: Secondary | ICD-10-CM

## 2024-11-03 MED ORDER — HYDROCODONE-ACETAMINOPHEN 10-325 MG PO TABS: 1.0000 | ORAL_TABLET | Freq: Four times a day (QID) | ORAL | 0 refills | Status: AC | PRN

## 2024-11-05 ENCOUNTER — Telehealth: Payer: Self-pay | Admitting: Podiatry

## 2024-11-05 ENCOUNTER — Other Ambulatory Visit: Payer: Self-pay

## 2024-11-05 ENCOUNTER — Telehealth: Admitting: Emergency Medicine

## 2024-11-05 ENCOUNTER — Telehealth: Admitting: Physician Assistant

## 2024-11-05 DIAGNOSIS — R3989 Other symptoms and signs involving the genitourinary system: Secondary | ICD-10-CM

## 2024-11-05 MED ORDER — ONDANSETRON 4 MG PO TBDP: 4.0000 mg | ORAL_TABLET | Freq: Three times a day (TID) | ORAL | 0 refills | Status: DC | PRN

## 2024-11-05 NOTE — Progress Notes (Signed)
 Did Evisit this morning for UTI, directed to in person care due to age/need for urinalysis and/or culture. After verifying identity, I advised pt I cannot help her for UTI. I asked if she needed help with anything else, she did not have any other needs. I explained she should be seen today in person for care.

## 2024-11-05 NOTE — Progress Notes (Signed)
 Patient called to request additional post-operative pain medication and zofran . She is 2 months s/p right foot surgery without complication. I discussed with her utilization of tylenol  for pain control. I prescribed Zofran  4mg  x 20 tabs PRN for her. Continue post-operative instructions per Dr. Pasco Ovens, DPM.  Prentice Ovens, DPM

## 2024-11-05 NOTE — Telephone Encounter (Signed)
 Patient left a voicemail stating she will be going out of town on Monday 11/10/24 for two weeks. She is requesting a refill of HYDROcodone -acetaminophen  (Norco/Vicodin) 5-325 mg, as she will run out by Sunday 11/09/24. She is also requesting a refill/order for Zofran .  Patient would like clarification on how long she needs to continue wearing the compression sock and for how many hours per day. She states she will not be using the knee crutch per your prior advice.  Please call the patient with updates regarding the above. Thank you.

## 2024-11-05 NOTE — Progress Notes (Signed)
  Because of increase in antibiotic resistant UTI and more complicated infections in those over 65, the standard of care if for an examination to be performed to check the kidneys and for a urine culture to be obtained prior to starting antibiotics. As such, I feel your condition warrants further evaluation and I recommend that you be seen in a face-to-face visit.   NOTE: There will be NO CHARGE for this E-Visit   If you are having a true medical emergency, please call 911.     For an urgent face to face visit, [facility] has multiple urgent care centers for your convenience.  Click the link below for the full list of locations and hours, walk-in wait times, appointment scheduling options and driving directions:  Urgent Care - [place], [place], [place], [place], [place], [place]  [place]     Your MyChart E-visit questionnaire answers were reviewed by a board certified advanced clinical practitioner to complete your personal care plan based on your specific symptoms.    Thank you for using e-Visits.

## 2024-11-06 ENCOUNTER — Other Ambulatory Visit: Payer: Self-pay | Admitting: Family Medicine

## 2024-11-06 DIAGNOSIS — G479 Sleep disorder, unspecified: Secondary | ICD-10-CM

## 2024-11-06 NOTE — Progress Notes (Signed)
 Subjective:   Patient ID: Dawn Arellano, female   DOB: 63 y.o.   MRN: [id number]   HPI Patient states she is doing pretty well but she still gets breakout pain at nighttime and it seems to be mostly around the second digit   ROS      Objective:  Physical Exam  Neurovascular status intact negative Toula' sign noted with good alignment mild reduction of motion around the first MPJ but I do not think patient's been working the joint well with no indications of other pathology with incision sites healed well     Assessment:  Overall seems to be doing well but still having pain more than I would like to see     Plan:  H&P x-ray reviewed and I discussed the importance of range of motion of the first MPJ and that we want her to increase it at this point.  Patient understands all of this and I did write her for a few more pain pills as she needs them for breakout pain but I strongly encouraged her to increase activity at this time and I want to see her back again in the next 4 to 6 weeks  X-rays indicate that the osteotomy is healing well alignment is good fixation in place with no other signs of pathology

## 2024-11-19 ENCOUNTER — Ambulatory Visit

## 2024-11-19 ENCOUNTER — Ambulatory Visit: Attending: Internal Medicine | Admitting: Internal Medicine

## 2024-11-19 ENCOUNTER — Encounter: Payer: Self-pay | Admitting: Internal Medicine

## 2024-11-19 VITALS — BP 138/76 | HR 84 | Ht 62.0 in | Wt 102.0 lb

## 2024-11-19 DIAGNOSIS — M62838 Other muscle spasm: Secondary | ICD-10-CM | POA: Diagnosis not present

## 2024-11-19 DIAGNOSIS — I493 Ventricular premature depolarization: Secondary | ICD-10-CM

## 2024-11-19 DIAGNOSIS — R002 Palpitations: Secondary | ICD-10-CM | POA: Diagnosis not present

## 2024-11-19 MED ORDER — BACLOFEN 10 MG PO TABS: 10.0000 mg | ORAL_TABLET | Freq: Every evening | ORAL | Status: DC | PRN

## 2024-11-19 NOTE — Patient Instructions (Signed)
 Medication Instructions:  Your physician recommends that you continue on your current medications as directed. Please refer to the Current Medication list given to you today.  *If you need a refill on your cardiac medications before your next appointment, please call your pharmacy*  Lab Work: None If you have labs (blood work) drawn today and your tests are completely normal, you will receive your results only by: MyChart Message (if you have MyChart) OR A paper copy in the mail If you have any lab test that is abnormal or we need to change your treatment, we will call you to review the results.  Testing/Procedures: Zio- 14 day live  Follow-Up: At [facility], you and your health needs are our priority.  As part of our continuing mission to provide you with exceptional heart care, our providers are all part of one team.  This team includes your primary Cardiologist (physician) and Advanced Practice Providers or APPs (Physician Assistants and Nurse Practitioners) who all work together to provide you with the care you need, when you need it.  Your next appointment:    Pending Testing Results   Provider:   You may see Vishnu P Mallipeddi, MD or one of the following Advanced Practice Providers on your designated Care Team:   Brittany Strader, PA-C  [place], [place] Olivia [place], [place]     We recommend signing up for the patient portal called MyChart.  Sign up information is provided on this After Visit Summary.  MyChart is used to connect with patients for Virtual Visits (Telemedicine).  Patients are able to view lab/test results, encounter notes, upcoming appointments, etc.  Non-urgent messages can be sent to your provider as well.   To learn more about what you can do with MyChart, go to [email or web address].   Other Instructions ZIO XT- Long Term Monitor Instructions   Your physician has requested you wear your ZIO patch monitor___14____days.   This is a single  patch monitor.  [employer] supplies one patch monitor per enrollment.  Additional stickers are not available.   Please do not apply patch if you will be having a Nuclear Stress Test, Echocardiogram, Cardiac CT, MRI, or Chest Xray during the time frame you would be wearing the monitor. The patch cannot be worn during these tests.  You cannot remove and re-apply the ZIO XT patch monitor.   Your ZIO patch monitor will be sent USPS Priority mail from [facility] directly to your home address. The monitor may also be mailed to a PO BOX if home delivery is not available.   It may take 3-5 days to receive your monitor after you have been enrolled.   Once you have received you monitor, please review enclosed instructions.  Your monitor has already been registered assigning a specific monitor serial # to you.   Applying the monitor   Shave hair from upper left chest.   Hold abrader disc by orange tab.  Rub abrader in 40 strokes over left upper chest as indicated in your monitor instructions.   Clean area with 4 enclosed alcohol pads .  Use all pads to assure are is cleaned thoroughly.  Let dry.   Apply patch as indicated in monitor instructions.  Patch will be place under collarbone on left side of chest with arrow pointing upward.   Rub patch adhesive wings for 2 minutes.Remove white label marked 1.  Remove white label marked 2.  Rub patch adhesive wings for 2 additional minutes.   While looking in  a mirror, press and release button in center of patch.  A small green light will flash 3-4 times .  This will be your only indicator the monitor has been turned on.     Do not shower for the first 24 hours.  You may shower after the first 24 hours.   Press button if you feel a symptom. You will hear a small click.  Record Date, Time and Symptom in the Patient Log Book.   When you are ready to remove patch, follow instructions on last 2 pages of Patient Log Book.  Stick patch monitor onto last  page of Patient Log Book.   Place Patient Log Book in [place] box.  Use locking tab on box and tape box closed securely.  The [employer] has [employer] on it.  Please place in mailbox as soon as possible.  Your physician should have your test results approximately 7 days after the monitor has been mailed back to [facility].   Call [facility] Customer Care at [phone] if you have questions regarding your ZIO XT patch monitor.  Call them immediately if you see an orange light blinking on your monitor.   If your monitor falls off in less than 4 days contact our Monitor department at [phone].  If your monitor becomes loose or falls off after 4 days call [employer] at [phone] for suggestions on securing your monitor.

## 2024-11-19 NOTE — Progress Notes (Signed)
 Cardiology Office Note  Date: 11/19/2024   ID: Dawn Arellano 19-Sep-1961, MRN [id number]  PCP:  Dawn Meade PEDLAR, FNP  Cardiologist:  None Electrophysiologist:  None   History of Present Illness: Dawn Arellano is a 63 y.o. female known to have HTN, HLD was referred to cardiology clinic for evaluation of palpitations.  Patient reports having palpitations couple of times a week.  Random.  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  She tells me that these PVCs might have recurred.  EKG today showed NSR, no evidence of PVCs.  Does not have any other symptoms of angina or DOE.  No dizziness, syncope, leg swelling.  Past Medical History:  Diagnosis Date   Allergy Nsaids.   Asthma 1989   Encounter for annual general medical examination with abnormal findings in adult 07/18/2023   Hyperlipidemia with target LDL less than 100 09/24/2023   Hypertension 2018    Past Surgical History:  Procedure Laterality Date   BREAST SURGERY Right 2000   right intraductal papilloma    Current Outpatient Medications  Medication Sig Dispense Refill   albuterol  (VENTOLIN  HFA) 108 (90 Base) MCG/ACT inhaler INHALE 2 PUFFS BY MOUTH EVERY 6 HOURS AS NEEDED FOR WHEEZING FOR SHORTNESS OF BREATH 9 g 0   Albuterol -Budesonide  (AIRSUPRA ) 90-80 MCG/ACT AERO Inhale 2 puffs into the lungs every 6 (six) hours as needed (SOB). Do NOT use more than 6 dose (12 puffs) in 24 hrs. Rinse your mouth with water after using to decrease the chance of getting a fungal infection (thrush) in your mouth and throat. 10.7 g 2   baclofen  (LIORESAL ) 10 MG tablet TAKE 1 TABLET BY MOUTH AT BEDTIME 60 tablet 0   glucosamine-chondroitin 500-400 MG tablet Take 1 tablet by mouth 3 (three) times daily.     lisinopril  (ZESTRIL ) 10 MG tablet Take 1 tablet (10 mg total) by mouth daily. 90 tablet 2   montelukast  (SINGULAIR ) 10 MG tablet TAKE 1 TABLET BY MOUTH AT BEDTIME 90 tablet 0   Multiple Vitamin (MULTIVITAMIN)  tablet Take 1 tablet by mouth daily.     ondansetron  (ZOFRAN -ODT) 4 MG disintegrating tablet Take 1 tablet (4 mg total) by mouth every 8 (eight) hours as needed for nausea or vomiting. 20 tablet 0   traZODone  (DESYREL ) 50 MG tablet TAKE 1 TABLET BY MOUTH AT BEDTIME 30 tablet 3   vitamin B-12 (CYANOCOBALAMIN) 1000 MCG tablet Take 1,000 mcg by mouth daily.     Zinc Sulfate (ZINC-220 PO) Take by mouth.     No current facility-administered medications for this visit.   Allergies:  Nsaids   Social History: The patient  reports that she has never smoked. She has never used smokeless tobacco. She reports current alcohol use of about 2.0 standard drinks of alcohol per week. She reports that she does not use drugs.   Family History: The patient's family history includes Cancer in her brother, father, and mother; Heart disease in her father; Hypertension in her mother.   ROS:  Please see the history of present illness. Otherwise, complete review of systems is positive for none.  All other systems are reviewed and negative.   Physical Exam: VS:  There were no vitals taken for this visit., BMI There is no height or weight on file to calculate BMI.  Wt Readings from Last 3 Encounters:  07/21/24 104 lb 1.3 oz (47.2 kg)  06/25/24 108 lb (49 kg)  02/04/24 108 lb 0.6 oz (  49 kg)    General: Patient appears comfortable at rest. HEENT: Conjunctiva and lids normal, oropharynx clear with moist mucosa. Neck: Supple, no elevated JVP or carotid bruits, no thyromegaly. Lungs: Clear to auscultation, nonlabored breathing at rest. Cardiac: Regular rate and rhythm, no S3 or significant systolic murmur, no pericardial rub. Abdomen: Soft, nontender, no hepatomegaly, bowel sounds present, no guarding or rebound. Extremities: No pitting edema, distal pulses 2+. Skin: Warm and dry. Musculoskeletal: No kyphosis. Neuropsychiatric: Alert and oriented x3, affect grossly appropriate.  Recent Labwork: 07/25/2024: ALT 17;  AST 21; BUN 16; Creatinine, Ser 0.54; Hemoglobin 12.8; Platelets 277; Potassium 4.0; Sodium 139; TSH 0.800     Component Value Date/Time   CHOL 208 (H) 07/25/2024 0817   CHOL 281 (H) 06/11/2023 0835   TRIG 105 07/25/2024 0817   HDL 66 07/25/2024 0817   HDL 60 06/11/2023 0835   CHOLHDL 3.2 07/25/2024 0817   VLDL 21 07/25/2024 0817   LDLCALC 121 (H) 07/25/2024 0817   LDLCALC 177 (H) 06/11/2023 0835     Assessment and Plan:  Palpitations - Palpitations occur couple times a week.  Random.  Feels like her PVCs returned back.  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  Not on rate controlling agents at this time. - EKG today showed NSR, no evidence of PVCs. - Obtain 2-week event monitor, live  HTN, partially controlled - Continue lisinopril  10 mg once daily.  Follows with PCP.  30 minutes spent in reviewing prior medical records, more than 3 labs, discussion and documentation.   Medication Adjustments/Labs and Tests Ordered: Current medicines are reviewed at length with the patient today.  Concerns regarding medicines are outlined above.    Disposition:  Follow up pending results  Signed, Betzayda Braxton Arleta Maywood, MD, 11/19/2024 3:21 PM    [facility] at [facility] [address], [place], [place] [address]

## 2024-11-21 ENCOUNTER — Ambulatory Visit: Admitting: Internal Medicine

## 2024-11-28 ENCOUNTER — Other Ambulatory Visit: Payer: Self-pay

## 2024-12-01 ENCOUNTER — Encounter: Admitting: Podiatry

## 2024-12-01 ENCOUNTER — Ambulatory Visit (INDEPENDENT_AMBULATORY_CARE_PROVIDER_SITE_OTHER)

## 2024-12-01 ENCOUNTER — Other Ambulatory Visit: Payer: Self-pay | Admitting: Family Medicine

## 2024-12-01 DIAGNOSIS — M2041 Other hammer toe(s) (acquired), right foot: Secondary | ICD-10-CM | POA: Diagnosis not present

## 2024-12-01 DIAGNOSIS — M7752 Other enthesopathy of left foot: Secondary | ICD-10-CM

## 2024-12-01 DIAGNOSIS — M779 Enthesopathy, unspecified: Secondary | ICD-10-CM

## 2024-12-01 DIAGNOSIS — J4520 Mild intermittent asthma, uncomplicated: Secondary | ICD-10-CM

## 2024-12-01 MED ORDER — HYDROCODONE-ACETAMINOPHEN 10-325 MG PO TABS: 1.0000 | ORAL_TABLET | Freq: Three times a day (TID) | ORAL | 0 refills | Status: AC | PRN

## 2024-12-01 MED ORDER — ONDANSETRON HCL 4 MG PO TABS: 4.0000 mg | ORAL_TABLET | Freq: Three times a day (TID) | ORAL | 0 refills | Status: AC | PRN

## 2024-12-01 NOTE — Progress Notes (Signed)
 Subjective:   Patient ID: Dawn Arellano, female   DOB: 63 y.o.   MRN: [id number]   HPI The patient presents with inflammation around the right foot moderate depression of the arch with chronic structural changes and pain in the second digit after surgery which was done several months ago which overall is doing well   ROS      Objective:  Physical Exam  Neurovascular status intact negative Toula' sign noted wound edges well coapted alignment is good with good structural and clinical positioning does have pain of the second toe with distal irritation also of nailbed and inflammation in the lesser MPJs of the localized nature     Assessment:  Inflammatory condition fluid buildup noted with keratotic tissue distal second toe right that may be irritated good range of motion     Plan:  At this point we did casted for functional orthotic devices to offload weight and I advised this patient on anti-inflammatories and I debrided tissue second digit and patient will be seen back and was written a prescription for hydrocodone  and Zofran .  Reappoint 4 weeks  X-rays look good overall osteotomy healing well still some healing in the lesser digits but I do think it will heal uneventfully and clinically looks very good

## 2024-12-15 ENCOUNTER — Ambulatory Visit: Payer: Self-pay | Admitting: Internal Medicine

## 2024-12-15 DIAGNOSIS — I493 Ventricular premature depolarization: Secondary | ICD-10-CM | POA: Diagnosis not present

## 2024-12-23 ENCOUNTER — Other Ambulatory Visit: Payer: Self-pay | Admitting: Family Medicine

## 2024-12-23 ENCOUNTER — Other Ambulatory Visit: Payer: Self-pay

## 2024-12-23 DIAGNOSIS — M62838 Other muscle spasm: Secondary | ICD-10-CM

## 2024-12-24 ENCOUNTER — Ambulatory Visit (INDEPENDENT_AMBULATORY_CARE_PROVIDER_SITE_OTHER): Admitting: Nurse Practitioner

## 2024-12-24 ENCOUNTER — Encounter: Payer: Self-pay | Admitting: Nurse Practitioner

## 2024-12-24 VITALS — BP 132/82 | HR 90 | Ht 62.0 in | Wt 107.0 lb

## 2024-12-24 DIAGNOSIS — G479 Sleep disorder, unspecified: Secondary | ICD-10-CM

## 2024-12-24 DIAGNOSIS — J320 Chronic maxillary sinusitis: Secondary | ICD-10-CM | POA: Diagnosis not present

## 2024-12-24 DIAGNOSIS — E785 Hyperlipidemia, unspecified: Secondary | ICD-10-CM

## 2024-12-24 DIAGNOSIS — I10 Essential (primary) hypertension: Secondary | ICD-10-CM | POA: Diagnosis not present

## 2024-12-24 DIAGNOSIS — G43001 Migraine without aura, not intractable, with status migrainosus: Secondary | ICD-10-CM

## 2024-12-24 DIAGNOSIS — J4520 Mild intermittent asthma, uncomplicated: Secondary | ICD-10-CM | POA: Diagnosis not present

## 2024-12-24 MED ORDER — TRAZODONE HCL 50 MG PO TABS: 50.0000 mg | ORAL_TABLET | Freq: Every day | ORAL | 1 refills | Status: AC

## 2024-12-24 MED ORDER — LISINOPRIL 10 MG PO TABS: 10.0000 mg | ORAL_TABLET | Freq: Every day | ORAL | 1 refills | Status: AC

## 2024-12-24 MED ORDER — MONTELUKAST SODIUM 10 MG PO TABS: 10.0000 mg | ORAL_TABLET | Freq: Every day | ORAL | 1 refills | Status: AC

## 2024-12-24 MED ORDER — AIRSUPRA 90-80 MCG/ACT IN AERO: 2.0000 | INHALATION_SPRAY | Freq: Four times a day (QID) | RESPIRATORY_TRACT | 2 refills | Status: AC | PRN

## 2024-12-24 NOTE — Patient Instructions (Signed)
 Sinus Pain  Sinus pain may occur when your sinuses become clogged or swollen. Sinuses are air-filled spaces in your skull that are behind the bones of your face and forehead. Sinus pain can range from mild to severe. What are the causes? Sinus pain can result from various conditions that affect the sinuses. Common causes include: Colds. Sinus infections. Allergies. What are the signs or symptoms? The main symptom of this condition is pain or pressure in your face, forehead, ears, or upper teeth. People who have sinus pain often have other symptoms, such as: Congested or runny nose. Fever. Inability to smell. Headache. Weather changes can make symptoms worse. How is this diagnosed? Your health care provider will diagnose this condition based on your symptoms and a physical exam. If you have pain that keeps coming back or does not go away, your health care provider may recommend more testing. This may include: Imaging tests, such as a CT scan or MRI, to check for problems with your sinuses. Examination of your sinuses using a thin tool with a camera that is inserted through your nose (endoscopy). How is this treated? Treatment for this condition depends on the cause. Sinus pain that is caused by a sinus infection may be treated with antibiotic medicine. Sinus pain that is caused by congestion may be helped by rinsing out (flushing) the nose and sinuses with saline solution. Sinus pain that is caused by allergies may be helped by allergy medicines (antihistamines) and medicated nasal sprays. Sinus surgery may be needed in some cases if other treatments do not help. Follow these instructions at home: General instructions If directed: Apply a warm, moist washcloth to your face to help relieve pain. Use a nasal saline wash. Follow the directions on the bottle or box. Hydrate and humidify Drink enough water to keep your urine clear or pale yellow. Staying hydrated will help to thin your  mucus. Use a humidifier if your home is dry. Inhale steam for 10-15 minutes, 3-4 times a day or as told by your health care provider. You can do this in the bathroom while a hot shower is running. Limit your exposure to cool or dry air. Medicines  Take over-the-counter and prescription medicines only as told by your health care provider. If you were prescribed an antibiotic medicine, take it as told by your health care provider. Do not stop taking the antibiotic even if you start to feel better. If you have congestion, use a nasal spray to help lessen pressure. Contact a health care provider if: You have sinus pain more than one time a week. You have sensitivity to light or sound. You develop a fever. You feel nauseous or you vomit. Your sinus pain or headache does not get better with treatment. Get help right away if: You have vision problems. You have sudden, severe pain in your face or head. You have a seizure. You are confused. You have a stiff neck. Summary Sinus pain occurs when your sinuses become clogged or swollen. Sinus pain can result from various conditions that affect the sinuses, such as a cold, a sinus infection, or an allergy. Treatment for this condition depends on the cause. It may include medicine, such as antibiotics or antihistamines. This information is not intended to replace advice given to you by your health care provider. Make sure you discuss any questions you have with your health care provider. Document Revised: 10/30/2021 Document Reviewed: 10/30/2021 Elsevier Patient Education  2024 [employer].

## 2024-12-24 NOTE — Progress Notes (Signed)
 "  Subjective:    Patient ID: Dawn Arellano, female    DOB: 25-Nov-1961, 64 y.o.   MRN: [id number]   Chief Complaint: medical management of chronic issues     HPI:  Dawn Arellano is a 64 y.o. who identifies as a female who was assigned female at birth.   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 Primary hypertension No c/o chest pain, sob or headache. Does not check blood pressure at home. BP Readings from Last 3 Encounters:  11/19/24 138/76  09/29/24 114/67  09/15/24 113/64     2. Hyperlipidemia with target LDL less than 100 Tries to watch diet and stay active. Is currently not on a statin. Tried statin but did not like how it made her feel so she stopped taking it. Lab Results  Component Value Date   CHOL 208 (H) 07/25/2024   HDL 66 07/25/2024   LDLCALC 121 (H) 07/25/2024   TRIG 105 07/25/2024   CHOLHDL 3.2 07/25/2024     3. Migraine without aura and with status migrainosus, not intractable Has not had any in awhile  4. Mild intermittent asthma without complication Is on airsupra  daily and singulair . Is doing well. Does not use albuterol  very often.  5. Sleep disturbance Is on trazadone to sleep and is sleeping well.   New complaints: Chronic sinus issues- would like some xrays  Allergies[1] Outpatient Encounter Medications as of 12/24/2024  Medication Sig   albuterol  (VENTOLIN  HFA) 108 (90 Base) MCG/ACT inhaler INHALE 2 PUFFS BY MOUTH EVERY 6 HOURS AS NEEDED FOR WHEEZING FOR SHORTNESS OF BREATH   Albuterol -Budesonide  (AIRSUPRA ) 90-80 MCG/ACT AERO Inhale 2 puffs into the lungs every 6 (six) hours as needed (SOB). Do NOT use more than 6 dose (12 puffs) in 24 hrs. Rinse your mouth with water after using to decrease the chance of getting a fungal infection (thrush) in your mouth and throat.   baclofen  (LIORESAL ) 10 MG tablet TAKE 1 TABLET BY MOUTH AT BEDTIME   Calcium   Carbonate (CALCIUM  500 PO) Take by mouth daily.   glucosamine-chondroitin 500-400 MG tablet Take 1 tablet by mouth daily.   [EXPIRED] HYDROcodone -acetaminophen  (NORCO) 10-325 MG tablet Take 1 tablet by mouth every 8 (eight) hours as needed for up to 20 days.   lisinopril  (ZESTRIL ) 10 MG tablet Take 1 tablet (10 mg total) by mouth daily.   MAGNESIUM COMPLEX PO Take by mouth daily.   montelukast  (SINGULAIR ) 10 MG tablet TAKE 1 TABLET BY MOUTH AT BEDTIME   Multiple Vitamin (MULTIVITAMIN) tablet Take 1 tablet by mouth daily.   ondansetron  (ZOFRAN ) 4 MG tablet Take 1 tablet (4 mg total) by mouth every 8 (eight) hours as needed for nausea or vomiting.   ondansetron  (ZOFRAN -ODT) 4 MG disintegrating tablet DISSOLVE 1 TABLET IN MOUTH EVERY 8 HOURS AS NEEDED FOR NAUSEA OR VOMITING   Thiamine HCl (B-1) 100 MG TABS Take 1 tablet by mouth daily.   traZODone  (DESYREL ) 50 MG tablet TAKE 1 TABLET BY MOUTH AT BEDTIME   vitamin B-12 (CYANOCOBALAMIN) 1000 MCG tablet Take 1,000 mcg by mouth daily.   Zinc Sulfate (ZINC-220 PO) Take by mouth daily.   [DISCONTINUED] baclofen  (LIORESAL ) 10 MG tablet Take 1 tablet (10 mg total) by mouth at bedtime as needed for muscle spasms.   [DISCONTINUED] ondansetron  (ZOFRAN -ODT) 4 MG disintegrating tablet DISSOLVE 1 TABLET IN MOUTH EVERY 8 HOURS AS NEEDED FOR NAUSEA OR  VOMITING   No facility-administered encounter medications on file as of 12/24/2024.    Past Surgical History:  Procedure Laterality Date   BREAST SURGERY Right 2000   right intraductal papilloma    Family History  Problem Relation Age of Onset   Cancer Mother    Hypertension Mother    Cancer Father    Heart disease Father    Cancer Brother        Review of Systems  Constitutional:  Negative for diaphoresis.  Eyes:  Negative for pain.  Respiratory:  Negative for shortness of breath.   Cardiovascular:  Negative for chest pain, palpitations and leg swelling.  Gastrointestinal:  Negative for abdominal  pain.  Endocrine: Negative for polydipsia.  Skin:  Negative for rash.  Neurological:  Negative for dizziness, weakness and headaches.  Hematological:  Does not bruise/bleed easily.  All other systems reviewed and are negative.      Objective:   Physical Exam Vitals and nursing note reviewed.  Constitutional:      General: She is not in acute distress.    Appearance: Normal appearance. She is well-developed.  HENT:     Head: Normocephalic.     Right Ear: Tympanic membrane normal.     Left Ear: Tympanic membrane normal.     Nose: Nose normal.     Mouth/Throat:     Mouth: Mucous membranes are moist.  Eyes:     Pupils: Pupils are equal, round, and reactive to light.  Neck:     Vascular: No carotid bruit or JVD.  Cardiovascular:     Rate and Rhythm: Normal rate and regular rhythm.     Heart sounds: Normal heart sounds.  Pulmonary:     Effort: Pulmonary effort is normal. No respiratory distress.     Breath sounds: Normal breath sounds. No wheezing or rales.  Chest:     Chest wall: No tenderness.  Abdominal:     General: Bowel sounds are normal. There is no distension or abdominal bruit.     Palpations: Abdomen is soft. There is no hepatomegaly, splenomegaly, mass or pulsatile mass.     Tenderness: There is no abdominal tenderness.  Musculoskeletal:        General: Normal range of motion.     Cervical back: Normal range of motion and neck supple.  Lymphadenopathy:     Cervical: No cervical adenopathy.  Skin:    General: Skin is warm and dry.  Neurological:     Mental Status: She is alert and oriented to person, place, and time.     Deep Tendon Reflexes: Reflexes are normal and symmetric.  Psychiatric:        Behavior: Behavior normal.        Thought Content: Thought content normal.        Judgment: Judgment normal.    BP (!) 143/89   Pulse 90   Ht 5' 2 (1.575 m)   Wt 107 lb (48.5 kg)   SpO2 98%   BMI 19.57 kg/m         Assessment & Plan:   [name]  comes in today with chief complaint of Medical Management of Chronic Issues (Five month follow up )   Diagnosis and orders addressed:  1. Primary hypertension (Primary) Dash diet - lisinopril  (ZESTRIL ) 10 MG tablet; Take 1 tablet (10 mg total) by mouth daily.  Dispense: 90 tablet; Refill: 1 - CBC with Differential/Platelet - CMP14+EGFR  2. Hyperlipidemia with target LDL less than 100 Low fat  diet - Lipid panel  3. Migraine without aura and with status migrainosus, not intractable Keep diary of any migraines Avoid caffeine   4. Mild intermittent asthma without complication Report any breathing issues - Albuterol -Budesonide  (AIRSUPRA ) 90-80 MCG/ACT AERO; Inhale 2 puffs into the lungs every 6 (six) hours as needed (SOB). Do NOT use more than 6 dose (12 puffs) in 24 hrs. Rinse your mouth with water after using to decrease the chance of getting a fungal infection (thrush) in your mouth and throat.  Dispense: 10.7 g; Refill: 2 - montelukast  (SINGULAIR ) 10 MG tablet; Take 1 tablet (10 mg total) by mouth at bedtime.  Dispense: 90 tablet; Refill: 1  5. Sleep disturbance Bedtime routine - traZODone  (DESYREL ) 50 MG tablet; Take 1 tablet (50 mg total) by mouth at bedtime.  Dispense: 90 tablet; Refill: 1  6. Chronic sinusitis Sinus xray ordered  Labs pending Health Maintenance reviewed Diet and exercise encouraged  Follow up plan: 6 months   Mary-Margaret Gladis, FNP      [1]  Allergies Allergen Reactions   Nsaids Anaphylaxis   "

## 2024-12-26 ENCOUNTER — Ambulatory Visit (HOSPITAL_COMMUNITY)
Admission: RE | Admit: 2024-12-26 | Discharge: 2024-12-26 | Disposition: A | Discharge status: Home / Self Care | Source: Ambulatory Visit | Attending: Nurse Practitioner | Admitting: Nurse Practitioner

## 2024-12-26 ENCOUNTER — Telehealth: Payer: Self-pay | Admitting: Podiatry

## 2024-12-26 DIAGNOSIS — J320 Chronic maxillary sinusitis: Secondary | ICD-10-CM | POA: Diagnosis present

## 2024-12-26 NOTE — Telephone Encounter (Signed)
 Spoke to Dawn Arellano. Advised orthotics are ready in the [place] office; need to schedule an appt for pick-up. Patient stated they were unable to lock in an appt date/time right now because they didn't have their work schedule available to make an appt right now & would call the office back to set up a time to [facility]

## 2024-12-31 ENCOUNTER — Ambulatory Visit: Payer: Self-pay | Admitting: Nurse Practitioner

## 2024-12-31 ENCOUNTER — Other Ambulatory Visit: Payer: Self-pay | Admitting: Family Medicine

## 2024-12-31 ENCOUNTER — Other Ambulatory Visit (HOSPITAL_COMMUNITY)
Admission: RE | Admit: 2024-12-31 | Discharge: 2024-12-31 | Disposition: A | Discharge status: Home / Self Care | Source: Ambulatory Visit | Attending: Nurse Practitioner | Admitting: Nurse Practitioner

## 2024-12-31 DIAGNOSIS — E038 Other specified hypothyroidism: Secondary | ICD-10-CM | POA: Diagnosis present

## 2024-12-31 DIAGNOSIS — E785 Hyperlipidemia, unspecified: Secondary | ICD-10-CM | POA: Diagnosis present

## 2024-12-31 DIAGNOSIS — I10 Essential (primary) hypertension: Secondary | ICD-10-CM | POA: Insufficient documentation

## 2024-12-31 LAB — CBC WITH DIFFERENTIAL/PLATELET
Abs Immature Granulocytes: 0.01 K/uL (ref 0.00–0.07)
Basophils Absolute: 0 K/uL (ref 0.0–0.1)
Basophils Relative: 1 %
Eosinophils Absolute: 0.1 K/uL (ref 0.0–0.5)
Eosinophils Relative: 2 %
HCT: 39.6 % (ref 36.0–46.0)
Hemoglobin: 13.1 g/dL (ref 12.0–15.0)
Immature Granulocytes: 0 %
Lymphocytes Relative: 34 %
Lymphs Abs: 1.6 K/uL (ref 0.7–4.0)
MCH: 31.7 pg (ref 26.0–34.0)
MCHC: 33.1 g/dL (ref 30.0–36.0)
MCV: 95.9 fL (ref 80.0–100.0)
Monocytes Absolute: 0.4 K/uL (ref 0.1–1.0)
Monocytes Relative: 8 %
Neutro Abs: 2.5 K/uL (ref 1.7–7.7)
Neutrophils Relative %: 55 %
Platelets: 306 K/uL (ref 150–400)
RBC: 4.13 MIL/uL (ref 3.87–5.11)
RDW: 12.9 % (ref 11.5–15.5)
WBC: 4.6 K/uL (ref 4.0–10.5)
nRBC: 0 % (ref 0.0–0.2)

## 2024-12-31 LAB — LIPID PANEL
Cholesterol: 199 mg/dL (ref 0–200)
HDL: 65 mg/dL
LDL Cholesterol: 115 mg/dL — ABNORMAL HIGH (ref 0–99)
Total CHOL/HDL Ratio: 3.1 ratio
Triglycerides: 97 mg/dL
VLDL: 19 mg/dL (ref 0–40)

## 2024-12-31 LAB — COMPREHENSIVE METABOLIC PANEL WITH GFR
ALT: 14 U/L (ref 0–44)
AST: 17 U/L (ref 15–41)
Albumin: 4.3 g/dL (ref 3.5–5.0)
Alkaline Phosphatase: 59 U/L (ref 38–126)
Anion gap: 10 (ref 5–15)
BUN: 17 mg/dL (ref 8–23)
CO2: 28 mmol/L (ref 22–32)
Calcium: 9.1 mg/dL (ref 8.9–10.3)
Chloride: 103 mmol/L (ref 98–111)
Creatinine, Ser: 0.63 mg/dL (ref 0.44–1.00)
GFR, Estimated: >60 mL/min
Glucose, Bld: 82 mg/dL (ref 70–99)
Potassium: 4.3 mmol/L (ref 3.5–5.1)
Sodium: 141 mmol/L (ref 135–145)
Total Bilirubin: 0.4 mg/dL (ref 0.0–1.2)
Total Protein: 6.8 g/dL (ref 6.5–8.1)

## 2024-12-31 LAB — T4, FREE: Free T4: 1.46 ng/dL (ref 0.80–2.00)

## 2024-12-31 LAB — TSH: TSH: 1.24 u[IU]/mL (ref 0.350–4.500)

## 2025-01-01 ENCOUNTER — Ambulatory Visit: Payer: Self-pay | Admitting: Nurse Practitioner

## 2025-01-01 DIAGNOSIS — J0100 Acute maxillary sinusitis, unspecified: Secondary | ICD-10-CM

## 2025-01-05 ENCOUNTER — Other Ambulatory Visit

## 2025-01-08 ENCOUNTER — Ambulatory Visit (INDEPENDENT_AMBULATORY_CARE_PROVIDER_SITE_OTHER): Admitting: Podiatrist

## 2025-01-08 DIAGNOSIS — M779 Enthesopathy, unspecified: Secondary | ICD-10-CM

## 2025-01-08 DIAGNOSIS — M722 Plantar fascial fibromatosis: Secondary | ICD-10-CM

## 2025-01-08 DIAGNOSIS — M21619 Bunion of unspecified foot: Secondary | ICD-10-CM

## 2025-01-08 DIAGNOSIS — M2041 Other hammer toe(s) (acquired), right foot: Secondary | ICD-10-CM

## 2025-01-08 NOTE — Progress Notes (Signed)
 ORTHOTIC DISPENSING:   Reason for Visit:         Fitting and Delivery of Custom Fabricated Foot Orthoses Patient Report:            Patient reports comfort and is satisfied with device.   OBJECTIVE DATA: Patient History / Diagnosis:    No change in pathology Provided Device:                     Functional foot orthoses   GOAL OF ORTHOSIS - Improve gait - Decrease energy expenditure - Improve Balance - Provide Triplanar stability of foot complex - Facilitate motion   ACTIONS PERFORMED Patient was fit with custom foot orthoses   Patient was provided with verbal and written instruction and demonstration regarding wear, care, proper fit, function, and use of the orthosis.    Patient was also provided with verbal instruction regarding how to report any failures or malfunctions of the orthosis and necessary follow up care. Patient was also instructed to contact our office regarding any change in status that may affect the function of the orthosis.   Patient demonstrated understanding of all instructions.  Lamarr Salen, DPM   Orthotics are metatarsal length and the drop off is somewhat bothersome on try on-   She will wear the orthotics for a couple days and if she wants to have them made full length she will bring them by and I will send them back to have them made full length.  Size 7 shoe.

## 2025-01-09 ENCOUNTER — Other Ambulatory Visit: Payer: Self-pay | Admitting: Family Medicine

## 2025-01-09 DIAGNOSIS — M62838 Other muscle spasm: Secondary | ICD-10-CM
